# Patient Record
Sex: Male | Born: 1935 | Race: White | Hispanic: No | Marital: Married | Smoking: Former smoker
Health system: Southern US, Community
[De-identification: ages and names within clinical notes are randomized; demographics above are authoritative.]

## PROBLEM LIST (undated history)

## (undated) DIAGNOSIS — G309 Alzheimer's disease, unspecified: Secondary | ICD-10-CM

## (undated) DIAGNOSIS — F0280 Dementia in other diseases classified elsewhere without behavioral disturbance: Secondary | ICD-10-CM

## (undated) HISTORY — PX: CORONARY ANGIOPLASTY WITH STENT PLACEMENT: SHX49

---

## 1999-08-27 ENCOUNTER — Other Ambulatory Visit: Admission: RE | Admit: 1999-08-27 | Discharge: 1999-08-27 | Payer: Self-pay | Admitting: Otolaryngology

## 2004-01-03 ENCOUNTER — Ambulatory Visit: Payer: Self-pay | Admitting: Podiatry

## 2004-10-15 ENCOUNTER — Ambulatory Visit: Payer: Self-pay | Admitting: Gastroenterology

## 2005-01-26 ENCOUNTER — Ambulatory Visit: Payer: Self-pay | Admitting: Podiatry

## 2005-01-29 ENCOUNTER — Ambulatory Visit: Payer: Self-pay | Admitting: Podiatry

## 2005-10-01 ENCOUNTER — Ambulatory Visit: Payer: Self-pay | Admitting: Ophthalmology

## 2005-10-14 ENCOUNTER — Ambulatory Visit: Payer: Self-pay | Admitting: Ophthalmology

## 2005-11-09 ENCOUNTER — Ambulatory Visit: Payer: Self-pay | Admitting: Family Medicine

## 2006-03-29 ENCOUNTER — Ambulatory Visit: Payer: Self-pay | Admitting: Otolaryngology

## 2006-04-15 ENCOUNTER — Ambulatory Visit: Payer: Self-pay | Admitting: Otolaryngology

## 2006-04-15 ENCOUNTER — Other Ambulatory Visit: Payer: Self-pay

## 2006-04-20 ENCOUNTER — Ambulatory Visit: Payer: Self-pay | Admitting: Otolaryngology

## 2007-01-22 ENCOUNTER — Encounter: Admission: RE | Admit: 2007-01-22 | Discharge: 2007-01-22 | Payer: Self-pay | Admitting: Orthopedic Surgery

## 2007-02-13 IMAGING — US SCREENING ULTRASOUND OF ABDOMINAL AORTA
1 series · 17 of 19 positions shown · non-contrast
Comparison: none

REASON FOR EXAM: Abnormal abdominal xray, evaluate for poss abdominal
aortic aneurysm, back pain
COMMENTS:

[Series 1: screening ultrasound of abdominal aorta · 17 of 19 slices shown]
[im 1/19]
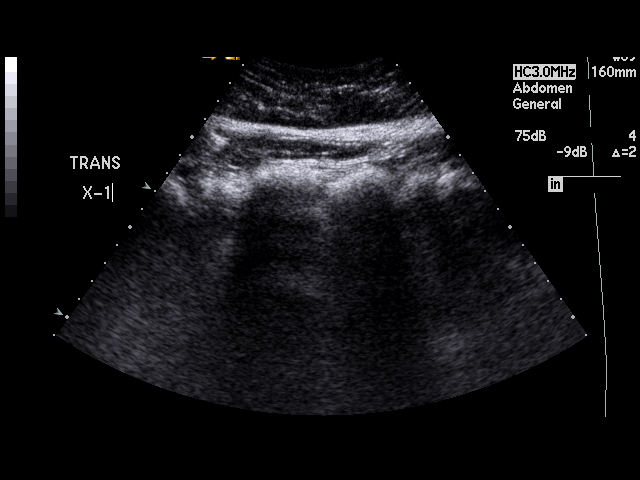
[im 2/19]
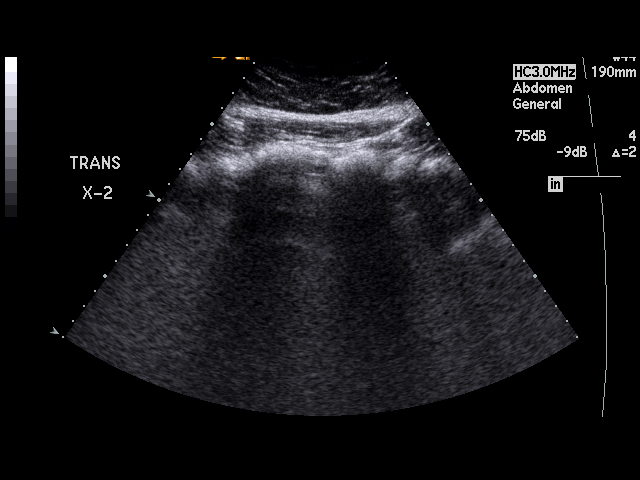
[im 3/19]
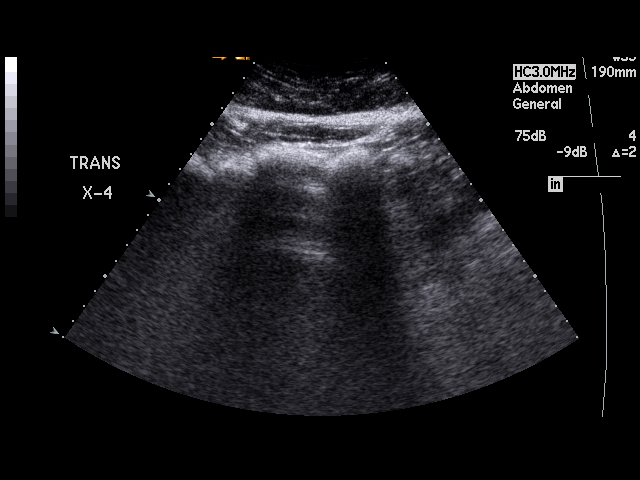
[im 4/19]
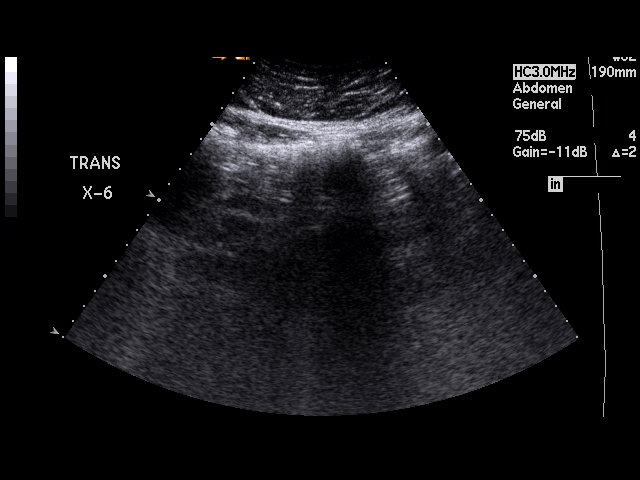
[im 6/19]
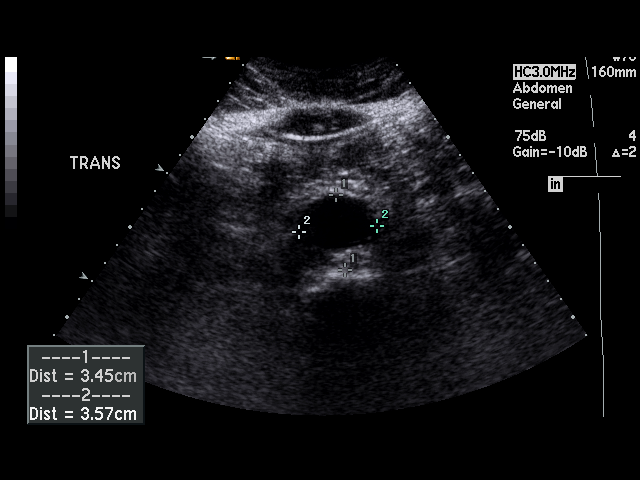
[im 7/19]
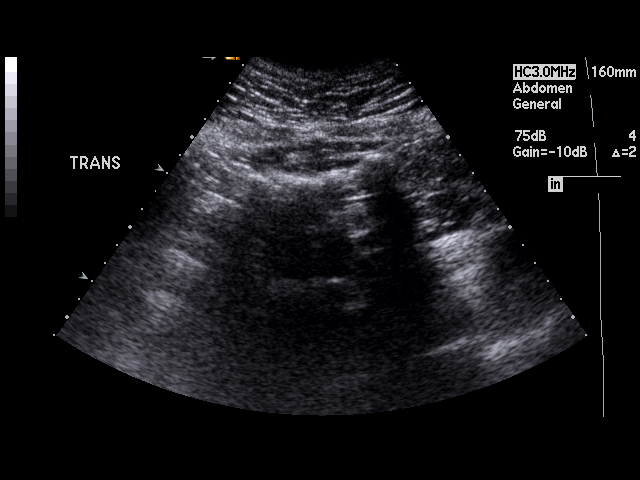
[im 8/19]
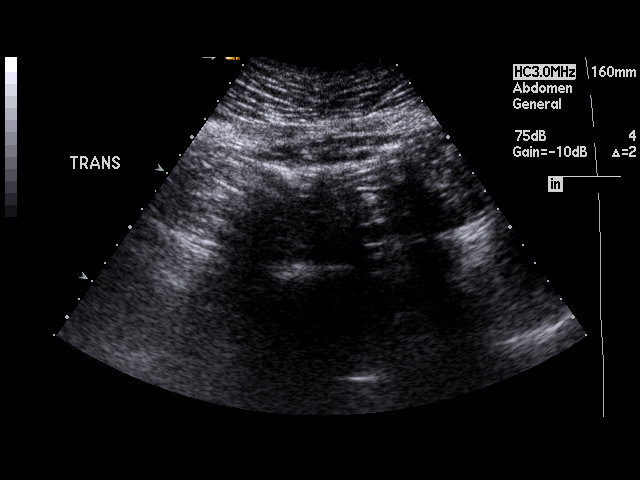
[im 9/19]
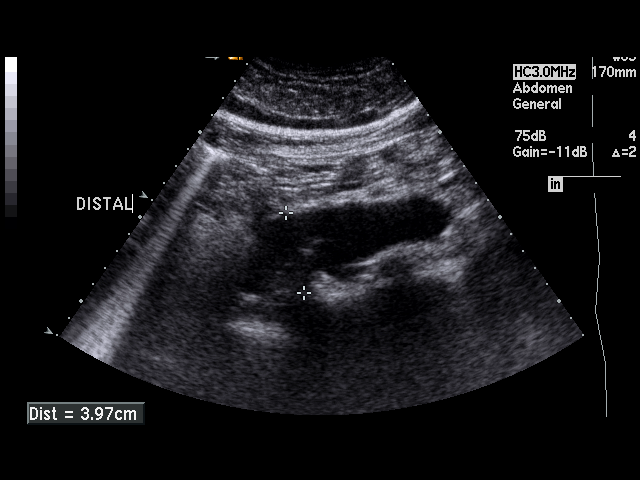
[im 10/19]
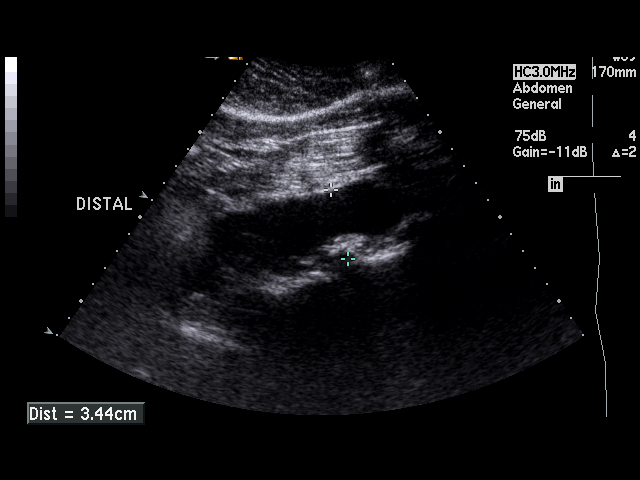
[im 11/19]
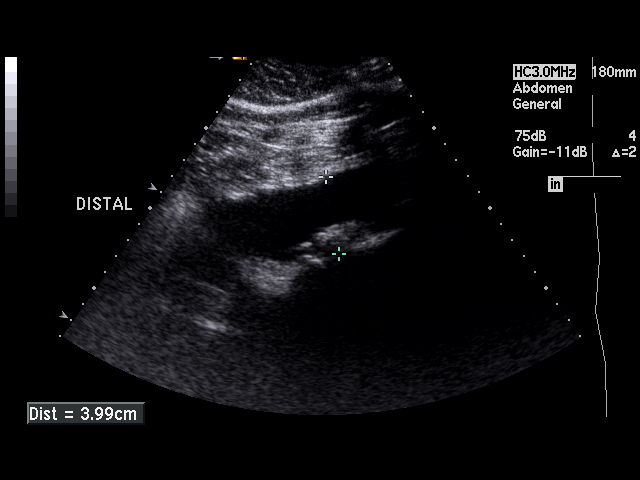
[im 12/19]
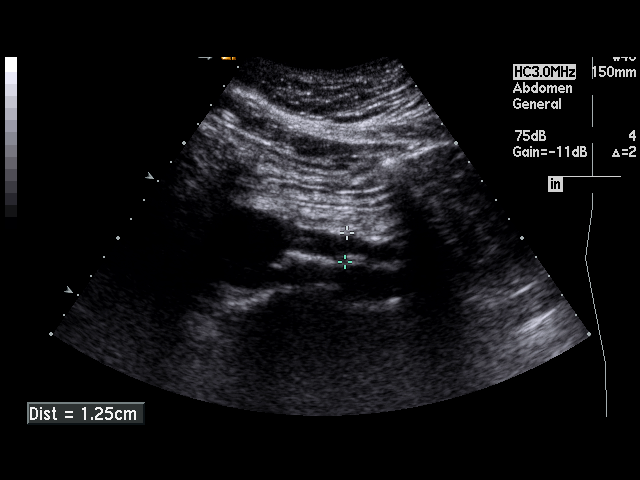
[im 13/19]
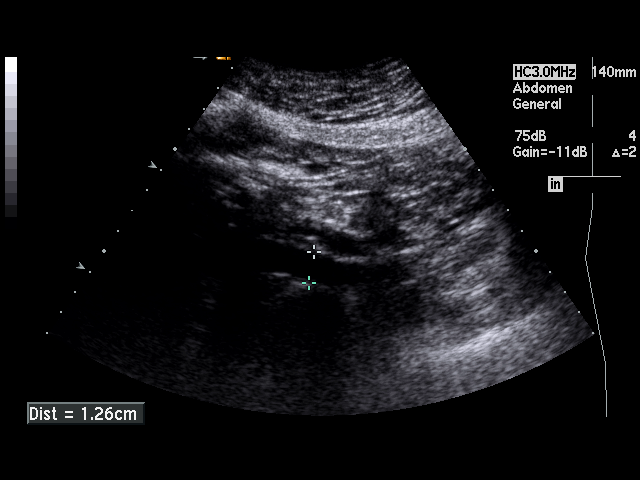
[im 14/19]
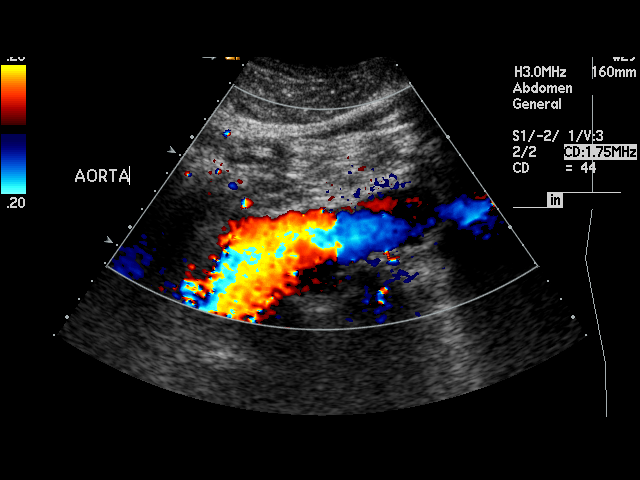
[im 16/19]
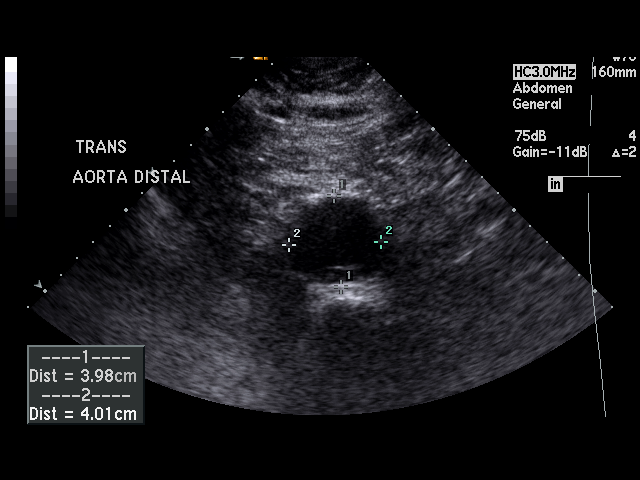
[im 17/19]
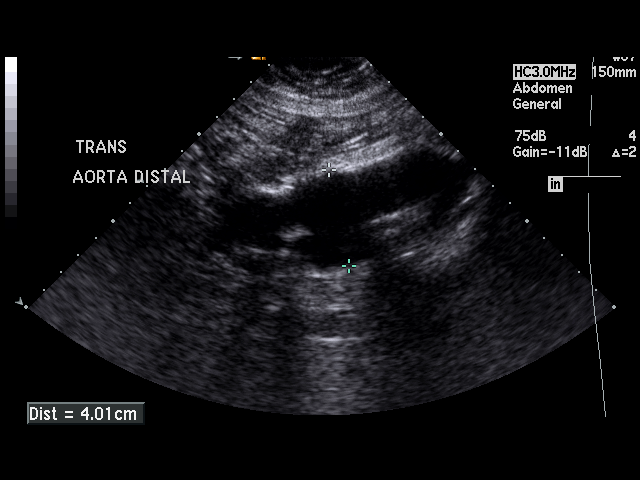
[im 18/19]
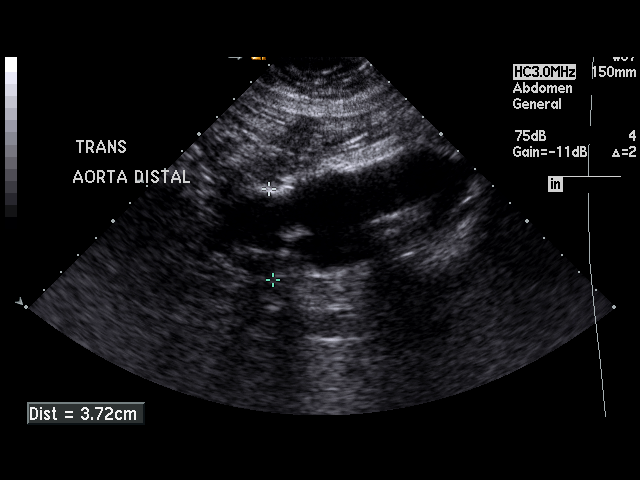
[im 19/19]
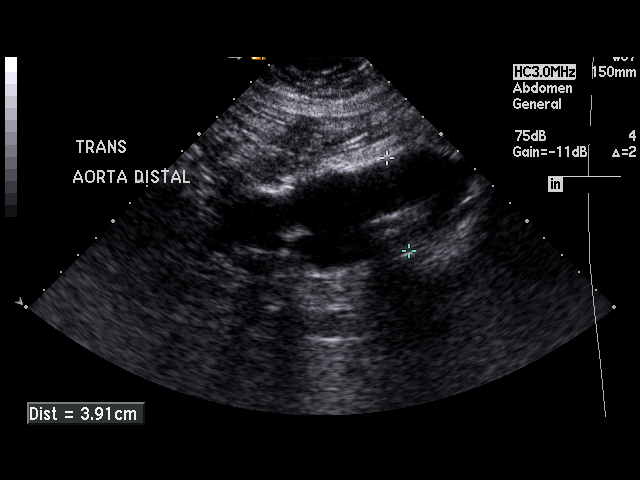

[17 of 19 positions shown; findings below may reference images not displayed]

PROCEDURE:     US  - US EXAM AAA SCREENING  - November 09, 2005  [DATE]

RESULT:     The abdominal aorta exhibits an infrarenal aneurysm measuring a
maximum of approximately 4 cm in diameter.  More proximally the aorta still
measures more than 3 cm in diameter.  The common iliac arteries are normal
in caliber.
IMPRESSION: There is an infrarenal abdominal aortic aneurysm measuring 4 cm in diameter.
 Further evaluation with abdominal CT scanning would be of value in an
effort to more adequately assess the caliber of the aorta throughout its
course in the abdomen.

## 2007-12-25 ENCOUNTER — Ambulatory Visit: Payer: Self-pay | Admitting: Unknown Physician Specialty

## 2009-03-20 ENCOUNTER — Ambulatory Visit: Payer: Self-pay | Admitting: Urology

## 2009-03-24 ENCOUNTER — Ambulatory Visit: Payer: Self-pay | Admitting: Urology

## 2009-04-23 ENCOUNTER — Ambulatory Visit: Payer: Self-pay | Admitting: Vascular Surgery

## 2009-06-17 ENCOUNTER — Ambulatory Visit: Payer: Self-pay | Admitting: Vascular Surgery

## 2009-06-25 ENCOUNTER — Inpatient Hospital Stay: Payer: Self-pay | Admitting: Vascular Surgery

## 2009-10-23 ENCOUNTER — Ambulatory Visit: Payer: Self-pay | Admitting: Urology

## 2009-10-29 ENCOUNTER — Ambulatory Visit: Payer: Self-pay | Admitting: Urology

## 2009-10-30 ENCOUNTER — Ambulatory Visit: Payer: Self-pay | Admitting: Urology

## 2010-11-30 ENCOUNTER — Ambulatory Visit: Payer: Self-pay | Admitting: Urology

## 2011-01-07 ENCOUNTER — Ambulatory Visit: Payer: Self-pay | Admitting: Urology

## 2011-01-13 ENCOUNTER — Ambulatory Visit: Payer: Self-pay | Admitting: Urology

## 2011-01-14 ENCOUNTER — Emergency Department: Payer: Self-pay | Admitting: Emergency Medicine

## 2011-01-15 LAB — PATHOLOGY REPORT

## 2011-03-31 ENCOUNTER — Ambulatory Visit: Payer: Self-pay | Admitting: Urology

## 2011-04-01 LAB — URINE CULTURE

## 2011-04-07 ENCOUNTER — Ambulatory Visit: Payer: Self-pay | Admitting: Urology

## 2011-04-11 LAB — PATHOLOGY REPORT

## 2012-03-29 ENCOUNTER — Ambulatory Visit: Payer: Self-pay | Admitting: Family Medicine

## 2012-04-13 ENCOUNTER — Ambulatory Visit: Payer: Self-pay

## 2012-05-03 ENCOUNTER — Ambulatory Visit: Payer: Self-pay

## 2012-05-24 ENCOUNTER — Ambulatory Visit: Payer: Self-pay

## 2012-06-20 ENCOUNTER — Ambulatory Visit: Payer: Self-pay | Admitting: Urology

## 2012-06-20 LAB — BASIC METABOLIC PANEL WITH GFR
Anion Gap: 7
BUN: 17 mg/dL
Calcium, Total: 8.4 mg/dL — ABNORMAL LOW
Chloride: 106 mmol/L
Co2: 26 mmol/L
Creatinine: 0.96 mg/dL
EGFR (African American): >60
EGFR (Non-African Amer.): >60
Glucose: 81 mg/dL
Osmolality: 278
Potassium: 4.4 mmol/L
Sodium: 139 mmol/L

## 2012-07-03 ENCOUNTER — Ambulatory Visit: Payer: Self-pay | Admitting: Urology

## 2012-08-10 ENCOUNTER — Ambulatory Visit: Payer: Self-pay | Admitting: Urology

## 2012-08-18 ENCOUNTER — Ambulatory Visit: Payer: Self-pay | Admitting: Urology

## 2012-11-27 ENCOUNTER — Ambulatory Visit: Payer: Self-pay | Admitting: Urology

## 2012-11-27 LAB — HEMOGLOBIN: HGB: 11.2 g/dL — ABNORMAL LOW (ref 13.0–18.0)

## 2012-12-04 ENCOUNTER — Ambulatory Visit: Payer: Self-pay | Admitting: Urology

## 2013-03-12 ENCOUNTER — Ambulatory Visit: Payer: Self-pay | Admitting: Urology

## 2014-06-14 NOTE — Op Note (Signed)
PATIENT NAME:  Nicholas [name], Nicholas [name] MR#:  [name] DATE OF BIRTH:  1931/12/07  DATE OF PROCEDURE:  08/18/2012  PREOPERATIVE DIAGNOSIS:  Distal right ureteral calculus with urethral stricture.   POSTOPERATIVE DIAGNOSIS:  Distal right ureteral calculus with urethral stricture.   PROCEDURE: Cystoscopy with ureteroscopy, laser lithotripsy and a direct vision internal urethrotomy of a bulbous urethral stricture that is chronic.   SURGEON:  Richard D. Edwyna ShellHart, DO  DESCRIPTION OF PROCEDURE:  With the patient sterilely prepped and draped in supine lithotomy position and good relaxation from a general anesthetic, I began the procedure. Unfortunately, the camera was not working so I did everything with my eye without camera today but this was nothing too difficult as it was the way I have always done it in the past. I viewed the urethra easily.  The stricture is a moderate-caliber stricture that will not admit a 19 to 20-French sheath, so I incised it under direct vision with a 0 degree lens and a cold cutting knife anteriorly and laterally to the left and right. That easily opened the stricture and I am able to view the prostate and the bladder. The prostate is small with a tiny middle lobe. The stent is lying right on the bladder and there is some irritation where the stent has been laying.  With a 21-French sheath and a 30 degree lens I view the stent, grasp it and pull it to the urethral meatus, then  instrument the stent with a 0.035 Glidewire. This puts the Glidewire up into the kidney. I used this as a safety wire. Then I used the telescoping 7-French ureteroscope and view the ureter. The ureter is easily instrumented as the stent has been in for a couple of weeks. The calculus is visualized. A 365 micron holmium laser was used to disintegrate this. This creates a good milieu for removal as it is in several small pieces. They are removed with a 4-wire, 0-tip Nitinol basket. There was no difficulty with the  removal, there is minimal bleeding. There was minimal shockwaves from the laser as we had it at the very lowest power and this very soft calculus disintegrated so then I evacuate any tiny clots that are left in the bladder. Where the stent was lying is an area of excoriation which should clear up so then I am able to empty the bladder, put 30 mL of 0.5% Marcaine in the bladder, [name] and O suppository and pull and remove the wire. The rectal exam reveals no tumors, masses or growths in the prostate or rectum.   ____________________________ Caralyn Guileichard D. Edwyna ShellHart, DO rdh:cs D: 08/18/2012 14:00:08 ET T: 08/18/2012 22:18:09 ET JOB#: [id number]  cc: Caralyn Guileichard D. Edwyna ShellHart, DO, <Dictator> RICHARD D HART DO ELECTRONICALLY SIGNED 09/15/2012 16:34

## 2014-06-14 NOTE — Op Note (Signed)
PATIENT NAME:  Nicholas [name], Nicholas [name] MR#:  [id number] DATE OF BIRTH:  [birth year]  DATE OF PROCEDURE:  12/04/2012  PREOPERATIVE DIAGNOSIS: Urethral stricture, ladder tumor.  POSTOPERATIVE DIAGNOSIS:  Urethral stricture, bladder tumor.    PROCEDURE: Transurethral resection medium-sized bladder tumor at the bladder neck with direct vision internal urethrotomy.    SURGEON: Khoen Genet D. Edwyna ShellHart, DO  ANESTHESIA: General.   PROCEDURE:  With the patient sterilely prepped and draped in supine lithotomy position, I used a standard direct vision internal urethrotomy scope with a 0.038 coaxial wire, placed the wire through the stricture and then cut along the wire to open up the stricture. There was no problem doing this, it opens up to admit a [facility] resect[facility]. The resectoscope sheath was placed in the bladder, bladder was viewed and there is a tumor at the bladder neck at the very anterior portion behind the bladder neck. So I am able to resect it with some difficulty but it is resected well with an Iglesias resectoscope and 26-French resectoscope and cutting loop. The pieces are evacuated out of the bladder. They are sent to pathology. There is no bleeding at the end of the procedure.  I put a Foley in without constant irrigation. We just have a regular 24-French Foley. We put some Marcaine in the bladder through the Foley in the amount of 30 mL of 0.5% plain Marcaine and the irrigation is clear. [name] and O suppository is placed. Bimanual exam reveals no fixation of the bladder. The patient is sent to recovery in satisfactory condition and followup will be done in 24 to 48 hours to remove the Foley.    ____________________________ Caralyn Guileichard D. Edwyna ShellHart, DO rdh:cs D: 12/04/2012 16:51:40 ET T: 12/04/2012 19:18:09 ET JOB#: [id number]  cc: Caralyn Guileichard D. Edwyna ShellHart, DO, <Dictator> Windell Musson D Nattie Lazenby DO ELECTRONICALLY SIGNED 01/15/2013 7:01

## 2014-06-14 NOTE — Op Note (Signed)
PATIENT NAME:  [name], Nicholas B MR#:  [name] DATE OF BIRTH:  [birth year]  DATE OF PROCEDURE:  07/03/2012  PREOPERATIVE DIAGNOSES: Recurrent bladder tumor and bulbous urethral stricture.  POSTOPERATIVE DIAGNOSES: Recurrent bladder tumor and bulbous urethral stricture with a  distal right ureteral calculus.   PROCEDURE: Cystoscopy, direct vision internal urethrotomy of a stricture, moderate-sized with fulguration of bladder tumors and placement of a right ureteral stent. With the patient sterilely prepped and draped in supine lithotomy position and good relaxation from a general anesthetic,  the procedure was begun. The surgeon has good vision with a direct vision internal urethrotomy scope. A 0.035 wire is placed through the stricture. Then along the wire, I incised the stricture in 1 spot. I tried not to cause any bleeding from this incision. I then am able to bypass the stricture easily with a scope and view the bladder. The cause of his pain, urgency, and frequency looks like a distal right ureteral calculus. Small tiny bladder tumors are recurrent on the right lateral  and anterior side of the right ureter where a previous tumor had been resected. These are fulgurated. Then after I fulgurated them with a Bovie electrode, I placed a 0.038 wire up past the calculus, dilating with a 6-French ureteral catheter, open-ended, which I had placed over the wire. Then I withdraw this, leave the wire in place and through the scope place a 7-French ureteral catheter into the kidney and it is in good position in the bladder by visualization. The bladder was emptied. Marcaine 30 mL was placed in the bladder. B and O suppository was placed and he was sent to recovery in satisfactory condition. The string was cut at the meatus. The patient tolerated the procedure well and I will follow him up in the office on Thursday.   ____________________________ Caralyn Guileichard D. Edwyna ShellHart, DO rdh:aw D: 07/03/2012 18:00:05  ET T: 07/04/2012 08:07:41 ET JOB#: [id number]  cc: Caralyn Guileichard D. Edwyna ShellHart, DO, <Dictator> Torianna Junio D Marquee Fuchs DO ELECTRONICALLY SIGNED 07/20/2012 17:14

## 2014-06-14 NOTE — H&P (Signed)
PATIENT NAME:  Nicholas [name], Nicholas [name] MR#:  [id number] DATE OF BIRTH:  1932-02-18  DATE OF ADMISSION:  07/03/2012  HISTORY OF PRESENT ILLNESS:  This patient presents with chronic discomfort at the head of his penis with urgency, frequency, dysuria and known history of bladder carcinoma.  Cystoscopy in the office has revealed the presence of a bulbous urethral stricture which was slightly dilated using flexible scope, but is still significant and only admits the 10-French diameter flexible scope with difficulty. He has behind this stricture, or proximal to it, he has quite a bit of irritation in his prostate.  Additionally, he has what appears to be possible recurrent bladder carcinoma that needs to be biopsied.  His history is that he has originally presented with a hematuria. He was placed on Cipro.  Symptoms include dysuria, urgency, frequency, hesitancy and pain on the tip of his penis. They last for 3 times an hour. He said it is relieved by oxycodone/acetaminophen.  His medications include Tramadol, donepezil, and we have tried him on Uribel.  He has been on tamsulosin 0.4 mg daily, which has not helped him.  REVIEW OF SYSTEMS:   CONSTITUTIONAL:  Today he is feeling well, but has no chills, fatigue, fever, or weight gain and is complaining mostly about his pain on voiding.   SKIN: He has had no hair loss, rash or itching. HEENT:  Cannot hear very well and you have to keep repeating yourself.  He appears well hydrated orally and has no problems with his sight.   NECK:  He has no history of thyroid disease. No neck pain. No diabetes.  RESPIRATORY:  He is not short of breath. Previous history of smoking, a pack a day for 40 years. No chronic cough.  With excessive exercise he has a little bit of shortness of breath on exertion. He does not have wheezing. He does not have sleep apnea. CARDIOVASCULAR:  Complains of no chest pain, severe shortness of breath, peripheral edema. He does not have leg edema at the  end of the day.  GASTROINTESTINAL:  No constipation, diarrhea.  Has nausea and vomiting with certain pain meds, such as hydrocodone.  MALE/GENITOURINARY:  See the HPI.   MUSCULOSKELETAL:  He has some back pain. No knee or ankle or elbow joint pain. NEUROLOGICAL:  He has some decreased memory. I would say he does not remember things very well at all.   PSYCHIATRIC:  Very anxious over his pain, depressed. ENDOCRINE:  No heat intolerance, tired, sluggishness, diabetes or thyroid disease.  HEMATOLOGIC:  No abnormal bleeding, bruising or history of anemia.  PAST SURGICAL HISTORY: TURBT. He has an aortoiliac stent graft.   PHYSICAL EXAMINATION:   GENERAL:  Tall, well-nourished, well-developed Caucasian male in acute distress from his voiding problems.  Review of CT with the patient reveals that he has probably passed his stone distally into his distal ureter and bladder as he is not having any back pain related to a previous stone. He had some calculi in his bladder.   LUNGS:  Today are clear to auscultation.  HEART:  Sounds are regular rate and rhythm.  ABDOMEN:  Bowel sounds present. Negative Murphy's as well as McBurney.   LYMPHATICS:  No supraclavicular, axillary or groin lymphadenopathy.  PERIPHERAL:  No peripheral edema. Leg strength is good. No joint pain. No calf pain. No evidence of pulmonary emboli type calf and leg pain.  HEENT:  His sight and hearing are excellent.  NECK:  Neck is palpated. No thyroid  enlargement. Neck is somewhat supple for his age. MUSCULOSKELETAL:  His upper extremities show no loss of range of motion. Hips have no loss of range of motion. So his musculoskeletal status is intact, except for his low back pain, which is more sacroiliac type pain, left and right.   IMPRESSION: Is that the patient has stricture disease, probable recurrent bladder tumor.   PLANS:  Cystoscopy, transurethral resection of bladder tumor, possible catheterization, and at most a 24 hour stay but  doubtful. The patient may have a catheter after incision of his stricture depending upon the amount of bleeding I encounter. I will try to do a cold cutting knife incision so we have minimal bleeding.  Followup will be either overnight in the hospital, but probably I will send him home either with or without catheter depending upon what we find at time of procedure and how much resection or biopsy I have to do and how much control of bleeding we have.  ____________________________ Caralyn Guileichard D. Edwyna ShellHart, DO rdh:sb D: 06/20/2012 09:05:00 ET T: 06/20/2012 09:20:21 ET JOB#: [id number]  cc: Caralyn Guileichard D. Edwyna ShellHart, DO, <Dictator> Sigmond Patalano D Maeci Kalbfleisch DO ELECTRONICALLY SIGNED 07/20/2012 17:12

## 2014-06-16 NOTE — Op Note (Signed)
PATIENT NAME:  [name], Nicholas B MR#:  [name] DATE OF BIRTH:  02-22-1932  DATE OF PROCEDURE:  04/07/2011  PREOPERATIVE DIAGNOSES:  1. Recurrent hematuria.  2. Bladder erythema with abnormal cytology.  3. History of transitional cell carcinoma of the bladder.   POSTOPERATIVE DIAGNOSES:  1. Recurrent hematuria.  2. Bladder erythema with abnormal cytology.  3. History of transitional cell carcinoma of the bladder.   PROCEDURE: Transurethral resection of bladder tumor (medium).   SURGEON: Irineo AxonScott Tearra Ouk, M.D.   ASSISTANT: None.   ANESTHESIA: General.   INDICATIONS: This is a 81 year old male with a history of recurrent transitional cell carcinoma of the bladder. He underwent internal urethrotomy and resection of a small bladder tumor in November 2012. He had persistent dysuria postoperatively and subsequently developed recurrent gross hematuria. Office cystoscopy showed complete healing of the stricture without recurrence. There was bladder erythema and friability. Urine cytology showed dysplastic urothelial cells and a FISH was positive. He presents for bladder biopsy/resection.   DESCRIPTION OF PROCEDURE: The patient was taken to the Operating Room where a general anesthetic was administered. He was placed in the low lithotomy position and his external genitalia were prepped and draped sterilely. A 28 French continuous flow resectoscope sheath with visual obturator was lubricated and passed under direct vision. The urethra was normal in caliber. The site of his prior internal urethrotomy was opened and the sheath passed easily. [facility] with a loop was then placed in the sheath. Panendoscopy demonstrated an area of early papillary change at the upper bladder neck and approximately at the 2 o'clock  position. There was a large patch of erythema and bladder mucosal edema on the posterior wall measuring approximately 4 cm. The ureteral orifices were normal appearing with clear  efflux. No other abnormalities were noted. The early papillary changes at the bladder neck were resected, removed with irrigation and sent in formalin. A second specimen of the area of erythema was resected. The resection site and residual erythema was cauterized with the button electrode. The bladder neck area was cauterized with a button electrode. There was incidentally noted to be a long extension of the verumontanum, which was also resected and sent separately. At the completion of the procedure, hemostasis was adequate. The bladder was emptied and the resectoscope was removed. An 8518 French catheter was placed with return of clear effluent upon irrigation. The patient was taken to the PAC-U in stable condition. A B and O suppository was placed per rectum. ____________________________ Verna CzechScott C. Lonna CobbStoioff, MD scs:slb D: 04/07/2011 11:39:51 ET T: 04/07/2011 12:20:02 ET JOB#: [id number]  cc: Lorin PicketScott C. Lonna CobbStoioff, MD, <Dictator> Riki AltesSCOTT C Leightyn Cina MD ELECTRONICALLY SIGNED 04/09/2011 3:17

## 2014-11-14 ENCOUNTER — Telehealth: Payer: Self-pay | Admitting: *Deleted

## 2014-11-14 NOTE — Telephone Encounter (Signed)
Received referral intake for hospice care for patient. Requesting oncologist last office note. Patient has known hx of bladder cancer. Hospice RN wants to know if patient was ever tx here at cancer center by Dr. Doylene Canning.  Reviewed chart. It does not appear that patient was tx here at the [facility]. It does appear that patient may have been tx with [facility] with Dr. Edwyna Shell. Deferred call to the urology office.

## 2014-11-26 ENCOUNTER — Observation Stay
Admission: EM | Admit: 2014-11-26 | Discharge: 2014-11-27 | Disposition: A | Discharge status: Home / Self Care | Attending: Internal Medicine | Admitting: Internal Medicine

## 2014-11-26 ENCOUNTER — Encounter: Payer: Self-pay | Admitting: Emergency Medicine

## 2014-11-26 ENCOUNTER — Emergency Department

## 2014-11-26 ENCOUNTER — Observation Stay
Admit: 2014-11-26 | Discharge: 2014-11-26 | Disposition: A | Discharge status: Home / Self Care | Attending: Specialist | Admitting: Specialist

## 2014-11-26 DIAGNOSIS — Z955 Presence of coronary angioplasty implant and graft: Secondary | ICD-10-CM | POA: Diagnosis not present

## 2014-11-26 DIAGNOSIS — R55 Syncope and collapse: Secondary | ICD-10-CM | POA: Diagnosis not present

## 2014-11-26 DIAGNOSIS — Z823 Family history of stroke: Secondary | ICD-10-CM | POA: Insufficient documentation

## 2014-11-26 DIAGNOSIS — Z8249 Family history of ischemic heart disease and other diseases of the circulatory system: Secondary | ICD-10-CM | POA: Insufficient documentation

## 2014-11-26 DIAGNOSIS — I739 Peripheral vascular disease, unspecified: Secondary | ICD-10-CM | POA: Diagnosis not present

## 2014-11-26 DIAGNOSIS — S61219A Laceration without foreign body of unspecified finger without damage to nail, initial encounter: Secondary | ICD-10-CM

## 2014-11-26 DIAGNOSIS — Z791 Long term (current) use of non-steroidal anti-inflammatories (NSAID): Secondary | ICD-10-CM | POA: Insufficient documentation

## 2014-11-26 DIAGNOSIS — Z87891 Personal history of nicotine dependence: Secondary | ICD-10-CM | POA: Diagnosis not present

## 2014-11-26 DIAGNOSIS — Z79899 Other long term (current) drug therapy: Secondary | ICD-10-CM | POA: Diagnosis not present

## 2014-11-26 DIAGNOSIS — S0101XA Laceration without foreign body of scalp, initial encounter: Secondary | ICD-10-CM

## 2014-11-26 DIAGNOSIS — S0191XA Laceration without foreign body of unspecified part of head, initial encounter: Secondary | ICD-10-CM | POA: Diagnosis not present

## 2014-11-26 DIAGNOSIS — S0990XA Unspecified injury of head, initial encounter: Secondary | ICD-10-CM | POA: Diagnosis not present

## 2014-11-26 DIAGNOSIS — G309 Alzheimer's disease, unspecified: Secondary | ICD-10-CM | POA: Insufficient documentation

## 2014-11-26 DIAGNOSIS — Z833 Family history of diabetes mellitus: Secondary | ICD-10-CM | POA: Insufficient documentation

## 2014-11-26 DIAGNOSIS — R918 Other nonspecific abnormal finding of lung field: Secondary | ICD-10-CM | POA: Diagnosis not present

## 2014-11-26 DIAGNOSIS — I471 Supraventricular tachycardia, unspecified: Secondary | ICD-10-CM | POA: Diagnosis present

## 2014-11-26 DIAGNOSIS — I10 Essential (primary) hypertension: Secondary | ICD-10-CM | POA: Diagnosis not present

## 2014-11-26 DIAGNOSIS — W19XXXA Unspecified fall, initial encounter: Secondary | ICD-10-CM | POA: Diagnosis not present

## 2014-11-26 DIAGNOSIS — F0280 Dementia in other diseases classified elsewhere without behavioral disturbance: Secondary | ICD-10-CM | POA: Insufficient documentation

## 2014-11-26 HISTORY — DX: Alzheimer's disease, unspecified: G30.9

## 2014-11-26 HISTORY — DX: Dementia in other diseases classified elsewhere, unspecified severity, without behavioral disturbance, psychotic disturbance, mood disturbance, and anxiety: F02.80

## 2014-11-26 LAB — COMPREHENSIVE METABOLIC PANEL
ALBUMIN: 3.1 g/dL — AB (ref 3.5–5.0)
ALK PHOS: 100 U/L (ref 38–126)
ALT: 16 U/L — ABNORMAL LOW (ref 17–63)
ANION GAP: 7 (ref 5–15)
AST: 33 U/L (ref 15–41)
BUN: 13 mg/dL (ref 6–20)
CO2: 21 mmol/L — AB (ref 22–32)
Calcium: 8.5 mg/dL — ABNORMAL LOW (ref 8.9–10.3)
Chloride: 108 mmol/L (ref 101–111)
Creatinine, Ser: 0.85 mg/dL (ref 0.61–1.24)
GFR calc Af Amer: >60 mL/min (ref >60)
GFR calc non Af Amer: >60 mL/min (ref >60)
GLUCOSE: 120 mg/dL — AB (ref 65–99)
POTASSIUM: 4 mmol/L (ref 3.5–5.1)
SODIUM: 136 mmol/L (ref 135–145)
Total Bilirubin: 1.2 mg/dL (ref 0.3–1.2)
Total Protein: 8.3 g/dL — ABNORMAL HIGH (ref 6.5–8.1)

## 2014-11-26 LAB — CBC
HEMATOCRIT: 45.5 % (ref 40.0–52.0)
HEMOGLOBIN: 14.9 g/dL (ref 13.0–18.0)
MCH: 28.5 pg (ref 26.0–34.0)
MCHC: 32.8 g/dL (ref 32.0–36.0)
MCV: 86.8 fL (ref 80.0–100.0)
Platelets: 147 10*3/uL — ABNORMAL LOW (ref 150–440)
RBC: 5.24 MIL/uL (ref 4.40–5.90)
RDW: 15.2 % — AB (ref 11.5–14.5)
WBC: 5.8 10*3/uL (ref 3.8–10.6)

## 2014-11-26 LAB — TROPONIN I
Troponin I: <0.03 ng/mL (ref <0.031)
Troponin I: <0.03 ng/mL (ref <0.031)
Troponin I: <0.03 ng/mL (ref <0.031)

## 2014-11-26 MED ORDER — HEPARIN SODIUM (PORCINE) 5000 UNIT/ML IJ SOLN
5000.0000 [IU] | Freq: Three times a day (TID) | INTRAMUSCULAR | Status: DC
  Administered 2014-11-26 – 2014-11-27 (×3): 5000 [IU] via SUBCUTANEOUS
  Filled 2014-11-26 (×4): qty 1

## 2014-11-26 MED ORDER — METOPROLOL TARTRATE 25 MG PO TABS
25.0000 mg | ORAL_TABLET | Freq: Two times a day (BID) | ORAL | Status: DC
  Administered 2014-11-26 – 2014-11-27 (×2): 25 mg via ORAL
  Filled 2014-11-26 (×3): qty 1

## 2014-11-26 MED ORDER — LORATADINE 10 MG PO TABS
10.0000 mg | ORAL_TABLET | Freq: Every day | ORAL | Status: DC
  Administered 2014-11-26 – 2014-11-27 (×2): 10 mg via ORAL
  Filled 2014-11-26 (×2): qty 1

## 2014-11-26 MED ORDER — SODIUM CHLORIDE 0.9 % IJ SOLN
3.0000 mL | Freq: Two times a day (BID) | INTRAMUSCULAR | Status: DC
  Administered 2014-11-26 – 2014-11-27 (×3): 3 mL via INTRAVENOUS

## 2014-11-26 MED ORDER — ACETAMINOPHEN 325 MG PO TABS: 650.0000 mg | ORAL_TABLET | Freq: Four times a day (QID) | ORAL | Status: DC | PRN

## 2014-11-26 MED ORDER — ACETAMINOPHEN 650 MG RE SUPP: 650.0000 mg | Freq: Four times a day (QID) | RECTAL | Status: DC | PRN

## 2014-11-26 MED ORDER — METOPROLOL TARTRATE 25 MG PO TABS
25.0000 mg | ORAL_TABLET | Freq: Once | ORAL | Status: AC
  Administered 2014-11-26: 25 mg via ORAL
  Filled 2014-11-26: qty 1

## 2014-11-26 MED ORDER — METOPROLOL TARTRATE 1 MG/ML IV SOLN
2.5000 mg | Freq: Once | INTRAVENOUS | Status: AC
  Administered 2014-11-26: 5 mg via INTRAVENOUS
  Filled 2014-11-26: qty 5

## 2014-11-26 MED ORDER — QUETIAPINE FUMARATE 25 MG PO TABS
50.0000 mg | ORAL_TABLET | Freq: Two times a day (BID) | ORAL | Status: DC
  Administered 2014-11-26 – 2014-11-27 (×3): 50 mg via ORAL
  Filled 2014-11-26 (×3): qty 2

## 2014-11-26 MED ORDER — ONDANSETRON HCL 4 MG PO TABS: 4.0000 mg | ORAL_TABLET | Freq: Four times a day (QID) | ORAL | Status: DC | PRN

## 2014-11-26 MED ORDER — ONDANSETRON HCL 4 MG/2ML IJ SOLN: 4.0000 mg | Freq: Four times a day (QID) | INTRAMUSCULAR | Status: DC | PRN

## 2014-11-26 NOTE — Consult Note (Signed)
[facility] Cardiology Consultation Note  Patient ID: Nicholas [name], MRN: [id number], DOB/AGE: [birth year] 81 y.o. Admit date: 11/26/2014   Date of Consult: 11/26/2014 Primary Physician: Sula Rumple, MD Primary Cardiologist: None  Chief Complaint:  Chief Complaint  Patient presents with  . Fall   Reason for Consult: atrial tachycardia after episode of fall  HPI: 81 y.o. male with known history of essential hypertension and history is peripheral vascular disease with apparent stenting having an episode where he fell. The patient patient fell and hit his head and was injured. The patient did not have any apparent syncope although history is difficult due to memory issues. The patient has had no evidence of previous falling in the past. Patient was seen in the emergency room and ended up with atrial tachycardia with rapid ventricular rate asymptomatic. There is been no evidence of chest pain congestive heart failure or other significant symptoms of hypotension. Currently EKG has converted back to normal sinus rhythm at this time  Past Medical History  Diagnosis Date  . Alzheimer disease       Surgical History:  Past Surgical History  Procedure Laterality Date  . Coronary angioplasty with stent placement       Home Meds: Prior to Admission medications   Medication Sig Start Date End Date Taking? Authorizing Provider  cetirizine (ZYRTEC) 10 MG tablet Take 10 mg by mouth daily.   Yes Historical Provider, MD  diphenhydrAMINE (SOMINEX) 25 MG tablet Take 25 mg by mouth daily as needed for itching or sleep.   Yes Historical Provider, MD  naproxen sodium (ANAPROX) 220 MG tablet Take 220 mg by mouth daily.   Yes Historical Provider, MD  QUEtiapine (SEROQUEL) 50 MG tablet Take 50 mg by mouth 2 (two) times daily.   Yes Historical Provider, MD    Inpatient Medications:     Allergies: No Known Allergies  Social History   Social History  . Marital Status: Married    Spouse  Name: N/A  . Number of Children: N/A  . Years of Education: N/A   Occupational History  . Not on file.   Social History Main Topics  . Smoking status: Former Games developer  . Smokeless tobacco: Not on file  . Alcohol Use: No  . Drug Use: No  . Sexual Activity: Not on file   Other Topics Concern  . Not on file   Social History Narrative  . No narrative on file     Family History  Problem Relation Age of Onset  . CVA Mother   . Peripheral vascular disease Father   . Diabetes Father   . Heart attack Brother      Review of Systems  Unable to assess due to some dementia Labs:  Recent Labs  11/26/14 0903  TROPONINI <0.03   Lab Results  Component Value Date   WBC 5.8 11/26/2014   HGB 14.9 11/26/2014   HCT 45.5 11/26/2014   MCV 86.8 11/26/2014   PLT 147* 11/26/2014    Recent Labs Lab 11/26/14 0903  NA 136  K 4.0  CL 108  CO2 21*  BUN 13  CREATININE 0.85  CALCIUM 8.5*  PROT 8.3*  BILITOT 1.2  ALKPHOS 100  ALT 16*  AST 33  GLUCOSE 120*   No results found for: CHOL, HDL, LDLCALC, TRIG No results found for: DDIMER  Radiology/Studies:  Ct Head Wo Contrast  11/26/2014   CLINICAL DATA:  Status post fall. Left head laceration. No loss of consciousness. Initial  encounter.  EXAM: CT HEAD WITHOUT CONTRAST  TECHNIQUE: Contiguous axial images were obtained from the base of the skull through the vertex without intravenous contrast.  COMPARISON:  Neck CT 03/29/2006.  FINDINGS: There is no evidence of acute intracranial hemorrhage, mass lesion, brain edema or extra-axial fluid collection. The ventricles and subarachnoid spaces are diffusely prominent, consistent with moderate atrophy. There is no CT evidence of acute cortical infarction. There are mild chronic small vessel ischemic changes within the periventricular and subcortical white matter bilaterally.  The visualized paranasal sinuses, mastoid air cells and middle ears are clear. The calvarium is intact. There is mild soft  tissue swelling in the left frontoparietal scalp.  IMPRESSION: 1. Left scalp soft tissue injury. No acute intracranial or calvarial findings. 2. Moderate atrophy and mild chronic small vessel ischemic changes.   Electronically Signed   By: Carey Bullocks M.D.   On: 11/26/2014 10:09   Dg Chest Portable 1 View  11/26/2014   CLINICAL DATA:  Pain following fall  EXAM: PORTABLE CHEST 1 VIEW  COMPARISON:  June 17, 2009  FINDINGS: There is no edema or consolidation. There is stable lateral apical pleural thickening on the left. The heart size and pulmonary vascularity are normal. No adenopathy. There is atherosclerotic change the aortic arch region. No bone lesions are appreciable.  IMPRESSION: No edema or consolidation.   Electronically Signed   By: Bretta Bang III M.D.   On: 11/26/2014 09:33    EKG: Normal sinus rhythm. Normal EKG  Weights: Filed Weights   11/26/14 0850  Weight: 237 lb (107.502 kg)     Physical Exam: Blood pressure 142/83, pulse 81, temperature 98.2 F (36.8 C), temperature source Oral, resp. rate 16, height  (1.854 m), weight 237 lb (107.502 kg), SpO2 96 %. Body mass index is 31.27 kg/(m^2). General: Well developed, well nourished, in no acute distress. Head eyes ears nose throat: Normocephalic, atraumatic, sclera non-icteric, no xanthomas, nares are without discharge. No apparent thyromegaly and/or mass  Lungs: Normal respiratory effort.  no wheezes, no rales, no rhonchi.  Heart: RRR with normal S1 S2. no murmur gallop, no rub, PMI is normal size and placement, carotid upstroke normal without bruit, jugular venous pressure is normal Abdomen: Soft, non-tender, non-distended with normoactive bowel sounds. No hepatomegaly. No rebound/guarding. No obvious abdominal masses. Abdominal aorta is normal size without bruit Extremities: No edema. no cyanosis, no clubbing, no ulcers  Peripheral : 2+ bilateral upper extremity pulses, 2+ bilateral femoral pulses, 2+ bilateral  dorsal pedal pulse Neuro: Alert and oriented. No facial asymmetry. No focal deficit. Moves all extremities spontaneously. Musculoskeletal: Normal muscle tone without kyphosis Psych:  Responds to questions appropriately with a normal affect.    Assessment: 81 year old male with essential hypertension with a fall and injury to his head with atrial tachycardia with rapid ventricular rate essentially asymptomatic at this time needing further treatment options  Plan: 1. Beta blocker 25 mg twice per day for heart rate control and maintenance of normal sinus rhythm 2. No anticoagulation due to fall risk and no evidence of atrial fibrillation at this time 3. Consider echocardiogram for LV systolic dysfunction valvular heart disease contributing to above 4. Begin ambulation and follow for further significant symptoms and need for adjustments of medication  Signed, Lamar Blinks M.D. [facility] Cardiology 11/26/2014, 12:52 PM

## 2014-11-26 NOTE — Progress Notes (Signed)
*  PRELIMINARY RESULTS* Echocardiogram 2D Echocardiogram has been performed.  Nicholas [name] 11/26/2014, 4:43 PM

## 2014-11-26 NOTE — ED Notes (Signed)
Hospitalist at bedside 

## 2014-11-26 NOTE — ED Notes (Signed)
Pt to ED via EMS transport from home, per pt's wife pt slipped and fell in the bathroom hitting head, denies any LOC, laceration to left side of head and laceration to right 3rd finger

## 2014-11-26 NOTE — ED Provider Notes (Signed)
[facility] Emergency Department Provider Note  ____________________________________________  Time seen: On arrival, via EMS  I have reviewed the triage vital signs and the nursing notes.   HISTORY  Chief Complaint Fall    HPI Nicholas [name] is a 81 y.o. male with a history of Alzheimer's dementia who had a mechanical fall today while in the bathroom. He suffered a laceration to the head and a laceration to the finger. He denies chest pain. He denies abdominal pain. He denies nausea denies vomiting. His wife reports he has a history of falls in the past. He lives at home.     Past Medical History  Diagnosis Date  . Alzheimer disease     There are no active problems to display for this patient.   Past Surgical History  Procedure Laterality Date  . Coronary angioplasty with stent placement      Current Outpatient Rx  Name  Route  Sig  Dispense  Refill  . cetirizine (ZYRTEC) 10 MG tablet   Oral   Take 10 mg by mouth daily.         . diphenhydrAMINE (SOMINEX) 25 MG tablet   Oral   Take 25 mg by mouth daily as needed for itching or sleep.         . naproxen sodium (ANAPROX) 220 MG tablet   Oral   Take 220 mg by mouth daily.         . QUEtiapine (SEROQUEL) 50 MG tablet   Oral   Take 50 mg by mouth 2 (two) times daily.           Allergies Review of patient's allergies indicates no known allergies.  No family history on file.  Social History Social History  Substance Use Topics  . Smoking status: Former Games developer  . Smokeless tobacco: None  . Alcohol Use: No    Review of Systems  Constitutional: Negative for fever. Eyes: Negative for visual changes. ENT: Negative for sore throat Cardiovascular: Negative for chest pain. Respiratory: Negative for shortness of breath. Gastrointestinal: Negative for abdominal pain, vomiting and diarrhea. Genitourinary: Negative for dysuria. Musculoskeletal: Negative for back pain. Skin:  Negative for rash. Positive for lacerations Neurological: Negative for headaches or focal weakness     ____________________________________________   PHYSICAL EXAM:  VITAL SIGNS: ED Triage Vitals  Enc Vitals Group     BP 11/26/14 0850 140/118 mmHg     Pulse Rate 11/26/14 0850 148     Resp 11/26/14 0850 18     Temp 11/26/14 0850 98.2 F (36.8 C)     Temp Source 11/26/14 0850 Oral     SpO2 11/26/14 0850 93 %     Weight 11/26/14 0850 237 lb (107.502 kg)     Height 11/26/14 0850  (1.854 m)     Head Cir --      Peak Flow --      Pain Score --      Pain Loc --      Pain Edu? --      Excl. in GC? --      Constitutional: Alert .Well appearing and in no distress. Eyes: Conjunctivae are normal.  ENT   Head: Normocephalic. Approximately 5 cm laceration to the left side of the forehead at the hairline which is linear   Mouth/Throat: Mucous membranes are moist. Cardiovascular: Normal rate, regular rhythm. Normal and symmetric distal pulses are present in all extremities. No murmurs, rubs, or gallops. Respiratory: Normal respiratory effort  without tachypnea nor retractions. Breath sounds are clear and equal bilaterally.  Gastrointestinal: Soft and non-tender in all quadrants. No distention. There is no CVA tenderness. Genitourinary: deferred Musculoskeletal: Nontender with normal range of motion in all extremities. Patient with curvilinear 1 cm shallow laceration to the right third finger, normal tendon function, bleeding controlled. 2+ cap refill Neurologic:  Normal speech and language. No gross focal neurologic deficits are appreciated. Skin:  Skin is warm, dry. 2 lacerations as noted above Psychiatric: Mood and affect are normal.   ____________________________________________    LABS (pertinent positives/negatives)  Labs Reviewed  CBC - Abnormal; Notable for the following:    RDW 15.2 (*)    Platelets 147 (*)    All other components within normal limits   COMPREHENSIVE METABOLIC PANEL - Abnormal; Notable for the following:    CO2 21 (*)    Glucose, Bld 120 (*)    Calcium 8.5 (*)    Total Protein 8.3 (*)    Albumin 3.1 (*)    ALT 16 (*)    All other components within normal limits  TROPONIN I    ____________________________________________   EKG  ED ECG REPORT I, Jene Every, the attending physician, personally viewed and interpreted this ECG.   Date: 11/26/2014  EKG Time: 9:05 AM  Rate: 155  Rhythm: Supraventricular tachycardia  Axis: Normal  Intervals:none  ST&T Change: Nonspecific   ____________________________________________    RADIOLOGY I have personally reviewed any xrays that were ordered on this patient: CT head no acute distress Chest x-ray unremarkable  ____________________________________________   PROCEDURES  Procedure(s) performed: yes  1. LACERATION REPAIR Performed by: Jene Every Authorized by: Jene Every Consent: Verbal consent obtained. Risks and benefits: risks, benefits and alternatives were discussed Consent given by: patient Patient identity confirmed: provided demographic data Prepped and Draped in normal sterile fashion Wound explored  Laceration Location: Scalp  Laceration Length: 5cm  No Foreign Bodies seen or palpated   Irrigation method: syringe Amount of cleaning: standard  Skin closure: Staples   Technique: 4 staples used   Patient tolerance: Patient tolerated the procedure well with no immediate complications.  2. LACERATION REPAIR Performed by: Jene Every Authorized by: Jene Every Consent: Verbal consent obtained. Risks and benefits: risks, benefits and alternatives were discussed Consent given by: patient Patient identity confirmed: provided demographic data Prepped and Draped in normal sterile fashion Wound explored  Laceration Location:right third finger Laceration Length: 1 cm  No Foreign Bodies seen or palpated  Irrigation  method: syringe Amount of cleaning: standard  Skin closure: skin glue  Finger splint applied to allow wound to heal.   Patient tolerance: Patient tolerated the procedure well with no immediate complications.   Critical Care performed: yes  CRITICAL CARE Performed by: Jene Every   Total critical care time: 30 min  Critical care time was exclusive of separately billable procedures and treating other patients.  Critical care was necessary to treat or prevent imminent or life-threatening deterioration.  Critical care was time spent personally by me on the following activities: development of treatment plan with patient and/or surrogate as well as nursing, discussions with consultants, evaluation of patient's response to treatment, examination of patient, obtaining history from patient or surrogate, ordering and performing treatments and interventions, ordering and review of laboratory studies, ordering and review of radiographic studies, pulse oximetry and re-evaluation of patient's condition.   ____________________________________________   INITIAL IMPRESSION / ASSESSMENT AND PLAN / ED COURSE  Pertinent labs & imaging results that were available  during my care of the patient were reviewed by me and considered in my medical decision making (see chart for details).  We were initially told the patient's fall was mechanical, however on the monitor patient has had intermittent episodes of significant tachycardia which appears to be superventricular. He denies chest pain. We will obtain IV, labs, chest x-ray and EKG.  ----------------------------------------- 9:10 AM on 11/26/2014 -----------------------------------------  EKG shows supraventricular tachycardia however patient has now reverted to normal sinus rhythm  After proximally 15 minutes patient again went into superventricular tachycardia at this time we gave 2.5 mg of metoprolol which improved his heart  rate.  ----------------------------------------- 11:03 AM on 11/26/2014 -----------------------------------------  Patient again went into supra-ventricular tachycardia while I was repairing his wounds but went back to normal sinus rhythm without intervention  ----------------------------------------- 11:20 AM on 11/26/2014 -----------------------------------------  Dr. Gwen Pounds of [facility] recommends metoprolol po and telemetry. He will see the patient  ____________________________________________   FINAL CLINICAL IMPRESSION(S) / ED DIAGNOSES  Final diagnoses:  Supraventricular tachycardia (HCC)  Scalp laceration, initial encounter  Fall, initial encounter  Finger laceration, initial encounter     Jene Every, MD 11/26/14 1133

## 2014-11-26 NOTE — Evaluation (Signed)
Physical Therapy Evaluation Patient Details Name: Nicholas [name] MRN: [id number] DOB: 11/28/1931 Today's Date: 11/26/2014   History of Present Illness  Patient is an 81 y/o male that presents after a fall, where he hit his head. In ER patient found to be in atrial tachycardia and RVR, though he was asymptomatic. Patient with history of Alzheimer's dementia.   Clinical Impression  Patient presents after fall and atrial tachycardia noted in the ER. Patient appeared to have HR controlled during this session maintaining in the 90s throughout ambulation, however he had uncontrolled stool leading to significant time being spent on clean up. Patient is very persverative asking the same questions repeatedly and is very impulsive with little to no ability to follow commands for more than 5-10 seconds at a time. While he does not display any obvious balance deficits, he is very impulsive and demonstrates no safety awareness. No caregiver present to provide history, however at this stage patient appears very difficult to [facility] after and may be appropriate for a memory care unit given his level of dementia. From a mobility standpoint he appears at or very near his baseline and would benefit from continued ambulation and balance training, it is unclear how much carryover there would be as patient does not consistently follow any commands or demonstrate any learning/short term memory in this session.     Follow Up Recommendations No PT follow up    Equipment Recommendations       Recommendations for Other Services       Precautions / Restrictions Precautions Precautions: Fall Restrictions Weight Bearing Restrictions: No      Mobility  Bed Mobility Overal bed mobility: Independent             General bed mobility comments: Patient transferred supine <--> sit independently. He is able to scoot in the bed independently.   Transfers Overall transfer level: Needs assistance    Transfers: Sit to/from Stand Sit to Stand: Supervision         General transfer comment: Patient is very impulsive and mild balance deficit with standing noted.   Ambulation/Gait Ambulation/Gait assistance: Supervision Ambulation Distance (Feet): 100 Feet       Gait velocity interpretation: at or above normal speed for age/gender General Gait Details: Patient requires constant cuing for directions and safety as he is very impulsive. He reaches out for hand hold assistance at multiple times for balance assistance.   Stairs            Wheelchair Mobility    Modified Rankin (Stroke Patients Only)       Balance Overall balance assessment: Needs assistance   Sitting balance-Leahy Scale: Normal Sitting balance - Comments: No balance deficits in sitting noted.      Standing balance-Leahy Scale: Fair Standing balance comment: Unable to perform formal balance test as patient was extremely impulsive and does not follow commands.                              Pertinent Vitals/Pain Pain Assessment: No/denies pain    Home Living Family/patient expects to be discharged to:: Private residence Living Arrangements: Spouse/significant other                    Prior Function Level of Independence: Independent         Comments: Patient is unable to provide any additional information.      Hand Dominance  Extremity/Trunk Assessment   Upper Extremity Assessment: Overall WFL for tasks assessed           Lower Extremity Assessment: Overall WFL for tasks assessed      Cervical / Trunk Assessment: Normal  Communication   Communication: No difficulties  Cognition Arousal/Alertness: Awake/alert Behavior During Therapy: Anxious;Restless Overall Cognitive Status: History of cognitive impairments - at baseline (Patient is very perseverative, repeating the same questions multiple times throughout the session.)       Memory: Decreased  short-term memory              General Comments General comments (skin integrity, edema, etc.): Patient soiled his finger pad trying to clean himself, PT had patient wash hands and informed RN. Lacerations to left forehead and R middle finger.     Exercises Other Exercises Other Exercises: Significant time spent cleaning patient and switching gowns x 2 after toilet transfers.       Assessment/Plan    PT Assessment Patient needs continued PT services  PT Diagnosis Difficulty walking   PT Problem List Decreased strength;Decreased balance;Decreased safety awareness;Decreased knowledge of precautions  PT Treatment Interventions DME instruction;Gait training;Therapeutic exercise;Therapeutic activities;Balance training   PT Goals (Current goals can be found in the Care Plan section) Acute Rehab PT Goals PT Goal Formulation: Patient unable to participate in goal setting Time For Goal Achievement: 12/10/14 Potential to Achieve Goals: Good    Frequency Min 2X/week   Barriers to discharge   Patient appears very difficult to manage and does not follow instructions. No obvious balance deficits noted.    Co-evaluation               End of Session Equipment Utilized During Treatment: Gait belt Activity Tolerance: Patient tolerated treatment well Patient left: in bed;with bed alarm set;with call bell/phone within reach Nurse Communication: Mobility status;Precautions    Functional Assessment Tool Used: Clinical judgement  Functional Limitation: Mobility: Walking and moving around Mobility: Walking and Moving Around Current Status (Z6109): At least 1 percent but less than 20 percent impaired, limited or restricted Mobility: Walking and Moving Around Goal Status [phone]): At least 1 percent but less than 20 percent impaired, limited or restricted    Time: 1350-1426 PT Time Calculation (min) (ACUTE ONLY): 36 min   Charges:   PT Evaluation $Initial PT Evaluation Tier I: 1  Procedure PT Treatments $Therapeutic Activity: 8-22 mins   PT G Codes:   PT G-Codes **NOT FOR INPATIENT CLASS** Functional Assessment Tool Used: Clinical judgement  Functional Limitation: Mobility: Walking and moving around Mobility: Walking and Moving Around Current Status (U9811): At least 1 percent but less than 20 percent impaired, limited or restricted Mobility: Walking and Moving Around Goal Status [phone]): At least 1 percent but less than 20 percent impaired, limited or restricted    Kerin Ransom, PT, DPT    11/26/2014, 4:30 PM

## 2014-11-26 NOTE — H&P (Signed)
[facility] - [place] at [facility]   PATIENT NAME: Nicholas [name]    MR#:  [id number]  DATE OF BIRTH:  [birth year]  DATE OF ADMISSION:  11/26/2014  PRIMARY CARE PHYSICIAN: Sula Rumple, MD   REQUESTING/REFERRING PHYSICIAN: Dr. Jene Every  CHIEF COMPLAINT:   Chief Complaint  Patient presents with  . Fall   SVT  HISTORY OF PRESENT ILLNESS:  Nicholas [name]  is a 81 y.o. male with a known history of Alzheimer's dementia, peripheral vascular disease status post stents who presents to the hospital due to a fall/syncope. Patient himself has dementia and therefore most of the history obtained from the wife at bedside. As per the wife patient had a fall in his bathroom today and cannot recall any of these events. He presented to the ER and had a laceration to his head and his right hand which were sutured by the emergency room physician. Patient on the emergency room at into SVT with heart rates in the 150s. He was given a dose of oral metoprolol and currently lives in normal sinus rhythm. There is some concern that this possibly could be a syncopal episode related to SVT and therefore hospitalist services were contacted for further treatment and evaluation.  PAST MEDICAL HISTORY:   Past Medical History  Diagnosis Date  . Alzheimer disease     PAST SURGICAL HISTORY:   Past Surgical History  Procedure Laterality Date  . Coronary angioplasty with stent placement      SOCIAL HISTORY:   Social History  Substance Use Topics  . Smoking status: Former Games developer  . Smokeless tobacco: Not on file  . Alcohol Use: No    FAMILY HISTORY:   Family History  Problem Relation Age of Onset  . CVA Mother   . Peripheral vascular disease Father   . Diabetes Father   . Heart attack Brother     DRUG ALLERGIES:  No Known Allergies  REVIEW OF SYSTEMS:   Review of Systems  Unable to perform ROS  due to patient's dementia  MEDICATIONS AT HOME:   Prior to  Admission medications   Medication Sig Start Date End Date Taking? Authorizing Provider  cetirizine (ZYRTEC) 10 MG tablet Take 10 mg by mouth daily.   Yes Historical Provider, MD  diphenhydrAMINE (SOMINEX) 25 MG tablet Take 25 mg by mouth daily as needed for itching or sleep.   Yes Historical Provider, MD  naproxen sodium (ANAPROX) 220 MG tablet Take 220 mg by mouth daily.   Yes Historical Provider, MD  QUEtiapine (SEROQUEL) 50 MG tablet Take 50 mg by mouth 2 (two) times daily.   Yes Historical Provider, MD      VITAL SIGNS:  Blood pressure 130/85, pulse 79, temperature 98.2 F (36.8 C), temperature source Oral, resp. rate 15, height  (1.854 m), weight 107.502 kg (237 lb), SpO2 97 %.  PHYSICAL EXAMINATION:  Physical Exam  GENERAL:  81 y.o.-year-old patient lying in the bed with no acute distress.  EYES: Pupils equal, round, reactive to light and accommodation. No scleral icterus. Extraocular muscles intact.  HEENT: Laceration to the head which is sutured. Oropharynx and nasopharynx clear. No oropharyngeal erythema, moist oral mucosa  NECK:  Supple, no jugular venous distention. No thyroid enlargement, no tenderness.  LUNGS: Normal breath sounds bilaterally, no wheezing, rales, rhonchi. No use of accessory muscles of respiration.  CARDIOVASCULAR: S1, S2 RRR. No murmurs, rubs, gallops, clicks.  ABDOMEN: Soft, nontender, nondistended. Bowel sounds present. No organomegaly or mass.  EXTREMITIES: No pedal edema, cyanosis, or clubbing. + 2 pedal & radial pulses b/l.   NEUROLOGIC: Cranial nerves II through XII are intact. No focal Motor or sensory deficits appreciated b/l.  Globally weak PSYCHIATRIC: The patient is alert and oriented x 2. Good affect.  SKIN: No obvious rash, lesion, or ulcer.   LABORATORY PANEL:   CBC  Recent Labs Lab 11/26/14 0903  WBC 5.8  HGB 14.9  HCT 45.5  PLT 147*    ------------------------------------------------------------------------------------------------------------------  Chemistries   Recent Labs Lab 11/26/14 0903  NA 136  K 4.0  CL 108  CO2 21*  GLUCOSE 120*  BUN 13  CREATININE 0.85  CALCIUM 8.5*  AST 33  ALT 16*  ALKPHOS 100  BILITOT 1.2   ------------------------------------------------------------------------------------------------------------------  Cardiac Enzymes  Recent Labs Lab 11/26/14 0903  TROPONINI <0.03   ------------------------------------------------------------------------------------------------------------------  RADIOLOGY:  Ct Head Wo Contrast  11/26/2014   CLINICAL DATA:  Status post fall. Left head laceration. No loss of consciousness. Initial encounter.  EXAM: CT HEAD WITHOUT CONTRAST  TECHNIQUE: Contiguous axial images were obtained from the base of the skull through the vertex without intravenous contrast.  COMPARISON:  Neck CT 03/29/2006.  FINDINGS: There is no evidence of acute intracranial hemorrhage, mass lesion, brain edema or extra-axial fluid collection. The ventricles and subarachnoid spaces are diffusely prominent, consistent with moderate atrophy. There is no CT evidence of acute cortical infarction. There are mild chronic small vessel ischemic changes within the periventricular and subcortical white matter bilaterally.  The visualized paranasal sinuses, mastoid air cells and middle ears are clear. The calvarium is intact. There is mild soft tissue swelling in the left frontoparietal scalp.  IMPRESSION: 1. Left scalp soft tissue injury. No acute intracranial or calvarial findings. 2. Moderate atrophy and mild chronic small vessel ischemic changes.   Electronically Signed   By: Carey Bullocks M.D.   On: 11/26/2014 10:09   Dg Chest Portable 1 View  11/26/2014   CLINICAL DATA:  Pain following fall  EXAM: PORTABLE CHEST 1 VIEW  COMPARISON:  June 17, 2009  FINDINGS: There is no edema or  consolidation. There is stable lateral apical pleural thickening on the left. The heart size and pulmonary vascularity are normal. No adenopathy. There is atherosclerotic change the aortic arch region. No bone lesions are appreciable.  IMPRESSION: No edema or consolidation.   Electronically Signed   By: Bretta Bang III M.D.   On: 11/26/2014 09:33     IMPRESSION AND PLAN:   81 year old male with past medical history of Alzheimer's dementia, peripheral vascular disease who presents to the hospital after a fall/syncope and noted to be in SVT.  #1 SVT-the etiology of this is unclear. There is no acute electrolyte abnormalities. Patient has no history of structural heart disease. -We'll observe on telemetry, place the patient on oral beta blockers. We'll check a two-dimensional echocardiogram, get a cardiology consult.  #2 status post fall-unclear this was a syncope that led to the fall. Patient CT head is negative for any acute pathology. He is a poor historian given his Alzheimer's dementia. -We'll get a physical therapy consult to assess his mobility.  #3 history of dementia without behavioral disturbance-continue Seroquel.   All the records are reviewed and case discussed with ED provider. Management plans discussed with the patient, family and they are in agreement.  CODE STATUS: Full  TOTAL TIME TAKING CARE OF THIS PATIENT: 45 minutes.    Houston Siren M.D on 11/26/2014 at 11:58 AM  Between  7am to 6pm - Pager - [phone]  After 6pm go to [email or web address] - password [email or web address] [facility]  [facility] [place] [facility]  Office  [phone]  CC: Primary care physician; Sula Rumple, MD

## 2014-11-27 LAB — BASIC METABOLIC PANEL
ANION GAP: 7 (ref 5–15)
BUN: 15 mg/dL (ref 6–20)
CHLORIDE: 107 mmol/L (ref 101–111)
CO2: 25 mmol/L (ref 22–32)
Calcium: 8.5 mg/dL — ABNORMAL LOW (ref 8.9–10.3)
Creatinine, Ser: 0.82 mg/dL (ref 0.61–1.24)
GFR calc Af Amer: >60 mL/min (ref >60)
Glucose, Bld: 94 mg/dL (ref 65–99)
POTASSIUM: 4.1 mmol/L (ref 3.5–5.1)
SODIUM: 139 mmol/L (ref 135–145)

## 2014-11-27 LAB — CBC
HEMATOCRIT: 45.3 % (ref 40.0–52.0)
HEMOGLOBIN: 14.9 g/dL (ref 13.0–18.0)
MCH: 28.6 pg (ref 26.0–34.0)
MCHC: 32.8 g/dL (ref 32.0–36.0)
MCV: 87.1 fL (ref 80.0–100.0)
Platelets: 160 10*3/uL (ref 150–440)
RBC: 5.2 MIL/uL (ref 4.40–5.90)
RDW: 15.6 % — ABNORMAL HIGH (ref 11.5–14.5)
WBC: 7.7 10*3/uL (ref 3.8–10.6)

## 2014-11-27 MED ORDER — METOPROLOL TARTRATE 25 MG PO TABS: 25.0000 mg | ORAL_TABLET | Freq: Two times a day (BID) | ORAL | Status: AC

## 2014-11-27 MED ORDER — METOPROLOL TARTRATE 1 MG/ML IV SOLN
2.5000 mg | Freq: Once | INTRAVENOUS | Status: DC
  Filled 2014-11-27: qty 5

## 2014-11-27 NOTE — Progress Notes (Signed)
Patient was A&O X1 (person and date of birth). He was calm and cooperative with occasional impulsive behavior. He has a Comptroller at bedside till 23:00.Patient continues to have occasional asymptomatic ST. Patient has no acute event overnight he rested well. The bed was kept low and bed alarm was on. Patient was offered bathroom with every safety checks. Will continue monitor.

## 2014-11-27 NOTE — Progress Notes (Signed)
[facility] Encounter Note  Patient: Nicholas [name] / Admit Date: 11/26/2014 / Date of Encounter: 11/27/2014, 7:41 AM   Subjective: No new trouble overnight Patient has had maintenance of normal sinus rhythm No evidence of syncope or heart block  Review of Systems:  Unable to express true review of systems Objective: Telemetry: Normal sinus rhythm Physical Exam: Blood pressure 112/75, pulse 147, temperature 97.6 F (36.4 C), temperature source Oral, resp. rate 18, height  (1.854 m), weight 237 lb (107.502 kg), SpO2 90 %. Body mass index is 31.27 kg/(m^2). General: Well developed, well nourished, in no acute distress. Head: Normocephalic, atraumatic, sclera non-icteric, no xanthomas, nares are without discharge. Neck: No apparent masses Lungs: Normal respirations with no wheezes, no rhonchi, no rales , no crackles   Heart: Regular rate and rhythm, normal S1 S2, 2+ aortic murmur, no rub, no gallop, PMI is normal size and placement, carotid upstroke normal without bruit, jugular venous pressure normal Abdomen: Soft, non-tender, non-distended with normoactive bowel sounds. No hepatosplenomegaly. Abdominal aorta is normal size without bruit Extremities: No edema, no clubbing, no cyanosis, no ulcers,  Peripheral: 2+ radial, 2+ femoral, 2+ dorsal pedal pulses Neuro: Alert and oriented. Moves all extremities spontaneously. Psych:  Responds to questions appropriately with a normal affect.   Intake/Output Summary (Last 24 hours) at 11/27/14 0741 Last data filed at 11/27/14 0635  Gross per 24 hour  Intake    120 ml  Output    550 ml  Net   -430 ml    Inpatient Medications:  . heparin  5,000 Units Subcutaneous 3 times per day  . loratadine  10 mg Oral Daily  . metoprolol  2.5 mg Intravenous Once  . metoprolol tartrate  25 mg Oral BID  . QUEtiapine  50 mg Oral BID  . sodium chloride  3 mL Intravenous Q12H   Infusions:    Labs:  Recent Labs   11/26/14 0903 11/27/14 0424  NA 136 139  K 4.0 4.1  CL 108 107  CO2 21* 25  GLUCOSE 120* 94  BUN 13 15  CREATININE 0.85 0.82  CALCIUM 8.5* 8.5*    Recent Labs  11/26/14 0903  AST 33  ALT 16*  ALKPHOS 100  BILITOT 1.2  PROT 8.3*  ALBUMIN 3.1*    Recent Labs  11/26/14 0903 11/27/14 0424  WBC 5.8 7.7  HGB 14.9 14.9  HCT 45.5 45.3  MCV 86.8 87.1  PLT 147* 160    Recent Labs  11/26/14 0903 11/26/14 1323 11/26/14 1815  TROPONINI <0.03 <0.03 <0.03   Invalid input(s): POCBNP No results for input(s): HGBA1C in the last 72 hours.   Weights: Filed Weights   11/26/14 0850  Weight: 237 lb (107.502 kg)     Radiology/Studies:  Ct Head Wo Contrast  11/26/2014   CLINICAL DATA:  Status post fall. Left head laceration. No loss of consciousness. Initial encounter.  EXAM: CT HEAD WITHOUT CONTRAST  TECHNIQUE: Contiguous axial images were obtained from the base of the skull through the vertex without intravenous contrast.  COMPARISON:  Neck CT 03/29/2006.  FINDINGS: There is no evidence of acute intracranial hemorrhage, mass lesion, brain edema or extra-axial fluid collection. The ventricles and subarachnoid spaces are diffusely prominent, consistent with moderate atrophy. There is no CT evidence of acute cortical infarction. There are mild chronic small vessel ischemic changes within the periventricular and subcortical white matter bilaterally.  The visualized paranasal sinuses, mastoid air cells and middle ears are clear. The  calvarium is intact. There is mild soft tissue swelling in the left frontoparietal scalp.  IMPRESSION: 1. Left scalp soft tissue injury. No acute intracranial or calvarial findings. 2. Moderate atrophy and mild chronic small vessel ischemic changes.   Electronically Signed   By: Carey Bullocks M.D.   On: 11/26/2014 10:09   Dg Chest Portable 1 View  11/26/2014   CLINICAL DATA:  Pain following fall  EXAM: PORTABLE CHEST 1 VIEW  COMPARISON:  June 17, 2009   FINDINGS: There is no edema or consolidation. There is stable lateral apical pleural thickening on the left. The heart size and pulmonary vascularity are normal. No adenopathy. There is atherosclerotic change the aortic arch region. No bone lesions are appreciable.  IMPRESSION: No edema or consolidation.   Electronically Signed   By: Bretta Bang III M.D.   On: 11/26/2014 09:33     Assessment and Recommendation  81 y.o. male with a mechanical fall of unknown etiology with injury to his head and incidental finding of supraventricular tachycardia which may be involved. The patient was placed on metoprolol with good result of the maintenance of normal sinus rhythm and no further episodes of issues. There is been no evidence of myocardial infarction 1. Continue metoprolol for maintenance of normal sinus rhythm 2. Begin ambulation and follow for need for assessment of unsteadiness or causes of mechanical fall 3. No further cardiac diagnostics necessary at this time 4. Okay for discharge home from cardiac standpoint with follow-up next week for adjustments of medication management  Signed, Arnoldo Hooker M.D. FACC

## 2014-11-27 NOTE — Discharge Summary (Signed)
[facility] - [place] at [facility]   PATIENT NAME: Nicholas [name]    MR#:  [id number]  DATE OF BIRTH:  15-Sep-1931  DATE OF ADMISSION:  11/26/2014 ADMITTING PHYSICIAN: Houston Siren, MD  DATE OF DISCHARGE: 11/27/2014 11:22 AM  PRIMARY CARE PHYSICIAN: Sula Rumple, MD    ADMISSION DIAGNOSIS:  Supraventricular tachycardia (HCC) [I47.1] Finger laceration, initial encounter [S61.219A] Fall, initial encounter [W19.XXXA] Scalp laceration, initial encounter [S01.01XA]  DISCHARGE DIAGNOSIS:  Active Problems:   SVT (supraventricular tachycardia) (HCC)   SECONDARY DIAGNOSIS:   Past Medical History  Diagnosis Date  . Alzheimer disease     HOSPITAL COURSE:   81 year old male without murmurs dementia and peripheral vascular disease who presented after fall and noted to be in SVT. For further details refer the H&P. 1. SVT: Etiology unclear. Patient was seen by cardiology. They recommended metoprolol. Patient has no evidence of syncope or heart block. Patient has had maintenance of normal sinus rhythm. Dr. Waldron Session recommended continue metoprolol and follow up in his office for echocardiogram results.  2. Alzheimer's dementia without behavioral disturbance: Continue Seroquel.    DISCHARGE CONDITIONS AND DIET:  Patient was discharged in fair condition with hospice on a regular diet  CONSULTS OBTAINED:  Treatment Team:  Lamar Blinks, MD  DRUG ALLERGIES:  No Known Allergies  DISCHARGE MEDICATIONS:   Discharge Medication List as of 11/27/2014 11:05 AM    START taking these medications   Details  metoprolol tartrate (LOPRESSOR) 25 MG tablet Take 1 tablet (25 mg total) by mouth 2 (two) times daily., Starting 11/27/2014, Until Discontinued, Normal      CONTINUE these medications which have NOT CHANGED   Details  cetirizine (ZYRTEC) 10 MG tablet Take 10 mg by mouth daily., Until Discontinued, Historical Med    diphenhydrAMINE (SOMINEX) 25 MG  tablet Take 25 mg by mouth daily as needed for itching or sleep., Until Discontinued, Historical Med    naproxen sodium (ANAPROX) 220 MG tablet Take 220 mg by mouth daily., Until Discontinued, Historical Med    QUEtiapine (SEROQUEL) 50 MG tablet Take 50 mg by mouth 2 (two) times daily., Until Discontinued, Historical Med              Today   CHIEF COMPLAINT:  Patient has dementia. No acute events overnight. Telemetry shows normal sinus rhythm   VITAL SIGNS:  Blood pressure 132/85, pulse 58, temperature 97.6 F (36.4 C), temperature source Oral, resp. rate 18, height  (1.854 m), weight 107.502 kg (237 lb), SpO2 97 %.   REVIEW OF SYSTEMS:  Review of Systems  Unable to perform ROS: dementia     PHYSICAL EXAMINATION:  GENERAL:  81 y.o.-year-old patient lying in the bed with no acute distress.  NECK:  Supple, no jugular venous distention. No thyroid enlargement, no tenderness.  LUNGS: Normal breath sounds bilaterally, no wheezing, rales,rhonchi  No use of accessory muscles of respiration.  CARDIOVASCULAR: S1, S2 normal. No murmurs, rubs, or gallops.  ABDOMEN: Soft, non-tender, non-distended. Bowel sounds present. No organomegaly or mass.  EXTREMITIES: No pedal edema, cyanosis, or clubbing.  PSYCHIATRIC: The patient is alert and oriented to name SKIN: No obvious rash, lesion, or ulcer. He has a sling on his finger  DATA REVIEW:   CBC  Recent Labs Lab 11/27/14 0424  WBC 7.7  HGB 14.9  HCT 45.3  PLT 160    Chemistries   Recent Labs Lab 11/26/14 0903 11/27/14 0424  NA 136 139  K 4.0 4.1  CL 108 107  CO2 21* 25  GLUCOSE 120* 94  BUN 13 15  CREATININE 0.85 0.82  CALCIUM 8.5* 8.5*  AST 33  --   ALT 16*  --   ALKPHOS 100  --   BILITOT 1.2  --     Cardiac Enzymes  Recent Labs Lab 11/26/14 0903 11/26/14 1323 11/26/14 1815  TROPONINI <0.03 <0.03 <0.03    Microbiology Results  @  RADIOLOGY:  Ct Head Wo Contrast  11/26/2014    CLINICAL DATA:  Status post fall. Left head laceration. No loss of consciousness. Initial encounter.  EXAM: CT HEAD WITHOUT CONTRAST  TECHNIQUE: Contiguous axial images were obtained from the base of the skull through the vertex without intravenous contrast.  COMPARISON:  Neck CT 03/29/2006.  FINDINGS: There is no evidence of acute intracranial hemorrhage, mass lesion, brain edema or extra-axial fluid collection. The ventricles and subarachnoid spaces are diffusely prominent, consistent with moderate atrophy. There is no CT evidence of acute cortical infarction. There are mild chronic small vessel ischemic changes within the periventricular and subcortical white matter bilaterally.  The visualized paranasal sinuses, mastoid air cells and middle ears are clear. The calvarium is intact. There is mild soft tissue swelling in the left frontoparietal scalp.  IMPRESSION: 1. Left scalp soft tissue injury. No acute intracranial or calvarial findings. 2. Moderate atrophy and mild chronic small vessel ischemic changes.   Electronically Signed   By: Carey Bullocks M.D.   On: 11/26/2014 10:09   Dg Chest Portable 1 View  11/26/2014   CLINICAL DATA:  Pain following fall  EXAM: PORTABLE CHEST 1 VIEW  COMPARISON:  June 17, 2009  FINDINGS: There is no edema or consolidation. There is stable lateral apical pleural thickening on the left. The heart size and pulmonary vascularity are normal. No adenopathy. There is atherosclerotic change the aortic arch region. No bone lesions are appreciable.  IMPRESSION: No edema or consolidation.   Electronically Signed   By: Bretta Bang III M.D.   On: 11/26/2014 09:33      Management plans discussed with the patient's wife and she is in agreement. Stable for discharge home with hospice  Patient should follow up with cardiology in 1 week  CODE STATUS:     Code Status Orders        Start     Ordered   11/26/14 1309  Full code   Continuous     11/26/14 1309    Advance  Directive Documentation        Most Recent Value   Type of Advance Directive  Healthcare Power of Attorney, Living will   Pre-existing out of facility DNR order (yellow form or pink MOST form)     "MOST" Form in Place?        TOTAL TIME TAKING CARE OF THIS PATIENT: 35 minutes.    Harvard Zeiss M.D on 11/27/2014 at 11:29 AM  Between 7am to 6pm - Pager - [phone] After 6pm go to [email or web address] - password [email or web address] [facility]  [facility]  Office  [phone]  CC: Primary care physician; Sula Rumple, MD

## 2014-12-26 ENCOUNTER — Emergency Department
Admission: EM | Admit: 2014-12-26 | Discharge: 2015-01-02 | Disposition: A | Discharge status: Other Acute Inpatient Hospital | Attending: Emergency Medicine | Admitting: Emergency Medicine

## 2014-12-26 ENCOUNTER — Encounter: Payer: Self-pay | Admitting: Emergency Medicine

## 2014-12-26 DIAGNOSIS — G309 Alzheimer's disease, unspecified: Secondary | ICD-10-CM

## 2014-12-26 DIAGNOSIS — Z87891 Personal history of nicotine dependence: Secondary | ICD-10-CM | POA: Insufficient documentation

## 2014-12-26 DIAGNOSIS — I471 Supraventricular tachycardia, unspecified: Secondary | ICD-10-CM | POA: Diagnosis present

## 2014-12-26 DIAGNOSIS — Z79899 Other long term (current) drug therapy: Secondary | ICD-10-CM | POA: Diagnosis not present

## 2014-12-26 DIAGNOSIS — Z791 Long term (current) use of non-steroidal anti-inflammatories (NSAID): Secondary | ICD-10-CM | POA: Insufficient documentation

## 2014-12-26 DIAGNOSIS — F0390 Unspecified dementia without behavioral disturbance: Secondary | ICD-10-CM | POA: Insufficient documentation

## 2014-12-26 DIAGNOSIS — G308 Other Alzheimer's disease: Secondary | ICD-10-CM

## 2014-12-26 DIAGNOSIS — F911 Conduct disorder, childhood-onset type: Secondary | ICD-10-CM | POA: Diagnosis not present

## 2014-12-26 DIAGNOSIS — Z008 Encounter for other general examination: Secondary | ICD-10-CM | POA: Diagnosis present

## 2014-12-26 DIAGNOSIS — F0391 Unspecified dementia with behavioral disturbance: Secondary | ICD-10-CM

## 2014-12-26 DIAGNOSIS — F0281 Dementia in other diseases classified elsewhere with behavioral disturbance: Secondary | ICD-10-CM

## 2014-12-26 DIAGNOSIS — R4689 Other symptoms and signs involving appearance and behavior: Secondary | ICD-10-CM

## 2014-12-26 LAB — COMPREHENSIVE METABOLIC PANEL
ALBUMIN: 3 g/dL — AB (ref 3.5–5.0)
ALK PHOS: 117 U/L (ref 38–126)
ALT: 14 U/L — AB (ref 17–63)
AST: 31 U/L (ref 15–41)
Anion gap: 7 (ref 5–15)
BILIRUBIN TOTAL: 0.7 mg/dL (ref 0.3–1.2)
BUN: 17 mg/dL (ref 6–20)
CO2: 23 mmol/L (ref 22–32)
Calcium: 8.8 mg/dL — ABNORMAL LOW (ref 8.9–10.3)
Chloride: 107 mmol/L (ref 101–111)
Creatinine, Ser: 1.05 mg/dL (ref 0.61–1.24)
GFR calc Af Amer: >60 mL/min (ref >60)
GFR calc non Af Amer: >60 mL/min (ref >60)
GLUCOSE: 133 mg/dL — AB (ref 65–99)
POTASSIUM: 4.1 mmol/L (ref 3.5–5.1)
Sodium: 137 mmol/L (ref 135–145)
TOTAL PROTEIN: 8.3 g/dL — AB (ref 6.5–8.1)

## 2014-12-26 LAB — ETHANOL: Alcohol, Ethyl (B): <5 mg/dL (ref <5)

## 2014-12-26 LAB — CBC
HEMATOCRIT: 46.8 % (ref 40.0–52.0)
Hemoglobin: 15.2 g/dL (ref 13.0–18.0)
MCH: 28.7 pg (ref 26.0–34.0)
MCHC: 32.5 g/dL (ref 32.0–36.0)
MCV: 88.2 fL (ref 80.0–100.0)
Platelets: 192 10*3/uL (ref 150–440)
RBC: 5.3 MIL/uL (ref 4.40–5.90)
RDW: 16.5 % — AB (ref 11.5–14.5)
WBC: 7.9 10*3/uL (ref 3.8–10.6)

## 2014-12-26 LAB — URINE DRUG SCREEN, QUALITATIVE (ARMC ONLY)
AMPHETAMINES, UR SCREEN: NOT DETECTED
BENZODIAZEPINE, UR SCRN: NOT DETECTED
Barbiturates, Ur Screen: NOT DETECTED
Cannabinoid 50 Ng, Ur ~~LOC~~: NOT DETECTED
Cocaine Metabolite,Ur ~~LOC~~: NOT DETECTED
MDMA (ECSTASY) UR SCREEN: NOT DETECTED
Methadone Scn, Ur: NOT DETECTED
OPIATE, UR SCREEN: NOT DETECTED
PHENCYCLIDINE (PCP) UR S: NOT DETECTED
Tricyclic, Ur Screen: POSITIVE — AB

## 2014-12-26 LAB — ACETAMINOPHEN LEVEL: Acetaminophen (Tylenol), Serum: <10 ug/mL — ABNORMAL LOW (ref 10–30)

## 2014-12-26 LAB — SALICYLATE LEVEL: Salicylate Lvl: <4 mg/dL (ref 2.8–30.0)

## 2014-12-26 MED ORDER — QUETIAPINE FUMARATE 25 MG PO TABS
100.0000 mg | ORAL_TABLET | Freq: Every day | ORAL | Status: DC
  Administered 2014-12-26 – 2015-01-01 (×7): 100 mg via ORAL
  Filled 2014-12-26 (×7): qty 4

## 2014-12-26 MED ORDER — METOPROLOL TARTRATE 25 MG PO TABS
25.0000 mg | ORAL_TABLET | Freq: Two times a day (BID) | ORAL | Status: DC
  Administered 2014-12-26 – 2015-01-02 (×14): 25 mg via ORAL
  Filled 2014-12-26 (×15): qty 1

## 2014-12-26 NOTE — ED Notes (Signed)
Patient's wife here for visitation.
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
## 2014-12-26 NOTE — ED Notes (Signed)
Pt in room. No complaints or concerns voiced at this time. No abnormal behavior noted at this time. Will continue to monitor with q15 min checks. ODS officer in area. 
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
## 2014-12-26 NOTE — ED Notes (Signed)
BEHAVIORAL HEALTH ROUNDING Patient sleeping: No. Patient alert and oriented: yes Behavior appropriate: Yes.   Nutrition and fluids offered: Yes  Toileting and hygiene offered: Yes  Sitter present: q15 min observations Law enforcement present: Yes [employer]  ENVIRONMENTAL ASSESSMENT Potentially harmful objects out of patient reach: Yes.   Personal belongings secured: Yes.   Patient dressed in hospital provided attire only: Yes.   Plastic bags out of patient reach: Yes.   Patient care equipment (cords, cables, call bells, lines, and drains) shortened, removed, or accounted for: Yes.   Equipment and supplies removed from bottom of stretcher: Yes.   Potentially toxic materials out of patient reach: Yes.   Sharps container removed or out of patient reach: Yes.   ED BHU PLACEMENT JUSTIFICATION Is the patient under IVC or is there intent for IVC: Yes.    Is IVC current? Yes Is the patient medically cleared: Yes.   Is there vacancy in the ED BHU: Yes.   Is the population mix appropriate for patient: No, pt is high fall risk, and requires assistance with ADLs. Is the patient awaiting placement in inpatient or outpatient setting: unknown at this time.   Has the patient had a psychiatric consult: Yes.   Survey of unit performed for contraband, proper placement and condition of furniture, tampering with fixtures in bathroom, shower, and each patient room: Yes.  ; Findings: none APPEARANCE/BEHAVIOR Cooperative,calm NEURO ASSESSMENT Orientation: person Hallucinations: none noted at this time Speech: Normal Gait: pt has not ambulated at this time. RESPIRATORY ASSESSMENT Breathing Pattern-regular, no respiratory distress noted CARDIOVASCULAR ASSESSMENT Skin color appropriate for age and race GASTROINTESTINAL ASSESSMENT no GI distress noted EXTREMITIES Moves all extremities, no distress noted PLAN OF CARE Provide calm/safe environment. Vital signs assessed twice daily. ED BHU Assessment  once each 12-hour shift. Collaborate with intake RN daily or as condition indicates. Assure the ED provider has rounded once each shift. Provide and encourage hygiene. Provide redirection as needed. Assess for escalating behavior; address immediately and inform ED provider.  Assess family dynamic and appropriateness for visitation as needed: Yes.  Educate the patient/family about BHU procedures/visitation: Yes.

## 2014-12-26 NOTE — ED Notes (Signed)
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
## 2014-12-26 NOTE — ED Notes (Signed)
[facility]  [facility] room. Pt complaining about roommate states "he needs to get out of here I can't sleep" I discussed with pt at this time there is no place to move wither pt or roommate to. No abnormal behavior noted at this time. Will continue to monitor with q15 min checks. [employer] officer in area.

## 2014-12-26 NOTE — ED Notes (Signed)
BEHAVIORAL HEALTH ROUNDING Patient sleeping: Yes.   Patient alert and oriented: not applicable Behavior appropriate: Yes.    Nutrition and fluids offered: No Toileting and hygiene offered: No Sitter present: q15 minute observations Law enforcement present: Yes [employer] 

## 2014-12-26 NOTE — ED Notes (Signed)
BEHAVIORAL HEALTH ROUNDING Patient sleeping: No. Patient alert and oriented: yes Behavior appropriate: Yes.  ; If no, describe:  Nutrition and fluids offered: Yes  Toileting and hygiene offered: Yes  Sitter present: not applicable Law enforcement present: No   
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
## 2014-12-27 MED ORDER — ACETAMINOPHEN 325 MG PO TABS
650.0000 mg | ORAL_TABLET | Freq: Once | ORAL | Status: AC
  Administered 2014-12-27: 650 mg via ORAL
  Filled 2014-12-27: qty 2

## 2014-12-27 NOTE — ED Notes (Signed)
Pt in room. No complaints or concerns voiced at this time. No abnormal behavior noted at this time. Will continue to monitor with q15 min checks. ODS officer in area. 
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
## 2014-12-27 NOTE — ED Notes (Signed)
BEHAVIORAL HEALTH ROUNDING Patient sleeping: Yes.   Patient alert and oriented: not applicable Behavior appropriate: Yes.    Nutrition and fluids offered: No Toileting and hygiene offered: No Sitter present: q15 minute observations Law enforcement present: Yes [employer] 

## 2014-12-27 NOTE — ED Notes (Signed)
BEHAVIORAL HEALTH ROUNDING Patient sleeping: Yes.   Patient alert and oriented: yes Behavior appropriate: Yes.  ; If no, describe:  Nutrition and fluids offered: Yes  Toileting and hygiene offered: Yes  Sitter present: yes Law enforcement present: Yes  
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
## 2014-12-27 NOTE — ED Notes (Signed)
BEHAVIORAL HEALTH ROUNDING Patient sleeping: Yes.   Patient alert and oriented: not applicable Behavior appropriate: No.; If no, describe:  Nutrition and fluids offered: Yes  Toileting and hygiene offered: Yes  Sitter present: no Law enforcement present: Yes

## 2014-12-27 NOTE — ED Notes (Signed)
BEHAVIORAL HEALTH ROUNDING Patient sleeping: No. Patient alert and oriented: no Behavior appropriate: Yes.  ; If no, describe:  Nutrition and fluids offered: Yes  Toileting and hygiene offered: Yes  Sitter present: no Law enforcement present: Yes  

## 2014-12-27 NOTE — ED Notes (Signed)

## 2014-12-27 NOTE — ED Notes (Signed)
Spilled urinal on bed, linens changed

## 2014-12-27 NOTE — ED Notes (Signed)
BEHAVIORAL HEALTH ROUNDING Patient sleeping: Yes.   Patient alert and oriented: no Behavior appropriate: Yes.  ; If no, describe:  Nutrition and fluids offered: Yes  Toileting and hygiene offered: Yes  Sitter present: no Law enforcement present: Yes  

## 2014-12-27 NOTE — ED Provider Notes (Signed)
-----------------------------------------   6:47 AM on 12/27/2014 -----------------------------------------   Blood pressure 151/108, pulse 115, temperature 97.8 F (36.6 C), temperature source Oral, resp. rate 18, height 6\' 1"  (1.854 m), weight 231 lb 7.7 oz (105 kg), SpO2 97 %.  The patient had no acute events since last update.  Calm and cooperative at this time.  Disposition is pending per Psychiatry/Behavioral Medicine team recommendations.     Irean HongJade J Sung, MD 12/27/14 [phone]

## 2014-12-27 NOTE — ED Notes (Signed)
BEHAVIORAL HEALTH ROUNDING Patient sleeping: No. Patient alert and oriented: yes Behavior appropriate: Yes.   Nutrition and fluids offered: Yes  Toileting and hygiene offered: Yes  Sitter present: q15 min observations  Law enforcement present: Yes [employer]  ENVIRONMENTAL ASSESSMENT Potentially harmful objects out of patient reach: Yes.   Personal belongings secured: Yes.   Patient dressed in hospital provided attire only: Yes.   Plastic bags out of patient reach: Yes.   Patient care equipment (cords, cables, call bells, lines, and drains) shortened, removed, or accounted for: Yes.   Equipment and supplies removed from bottom of stretcher: Yes.   Potentially toxic materials out of patient reach: Yes.   Sharps container removed or out of patient reach: Yes.    ED BHU PLACEMENT JUSTIFICATION Is the patient under IVC or is there intent for IVC: Yes.    Is IVC current? Yes Is the patient medically cleared: Yes.   Is there vacancy in the ED BHU: Yes.   Is the population mix appropriate for patient: No, Pt with shuffling gait ,is high fall risk. Requires assistance with ADLs Is the patient awaiting placement in inpatient or outpatient setting: Yes.   Has the patient had a psychiatric consult: Yes.   Survey of unit performed for contraband, proper placement and condition of furniture, tampering with fixtures in bathroom, shower, and each patient room: Yes.  ; Findings: none APPEARANCE/BEHAVIOR Cooperative,calm NEURO ASSESSMENT Orientation: person Hallucinations: none noted at this time Speech: Normal Gait: shuffling RESPIRATORY ASSESSMENT Breathing Pattern-regular, no respiratory distress noted CARDIOVASCULAR ASSESSMENT Skin color appropriate for age and race GASTROINTESTINAL ASSESSMENT no GI distress noted EXTREMITIES Moves all extremities, no distress noted PLAN OF CARE Provide calm/safe environment. Vital signs assessed twice daily. ED BHU Assessment once each 12-hour  shift. Collaborate with intake RN daily or as condition indicates. Assure the ED provider has rounded once each shift. Provide and encourage hygiene. Provide redirection as needed. Assess for escalating behavior; address immediately and inform ED provider.  Assess family dynamic and appropriateness for visitation as needed: Yes.  Educate the patient/family about BHU procedures/visitation: Yes.

## 2014-12-27 NOTE — ED Notes (Signed)
Pt needs help with meals, will say no a couple of times then he eats

## 2014-12-27 NOTE — BHH Counselor (Signed)
Per Dr. Toni Amendlapacs, pt meets criteria for inpatient hospitalization.  [facility], [facility], and [place].

## 2014-12-27 NOTE — ED Notes (Signed)
Report received from RN Kathy C.  Pt in room. No complaints or concerns voiced at this time. No abnormal behavior noted at this time. Will continue to monitor with q15 min checks. ODS officer in area. 

## 2014-12-27 NOTE — ED Notes (Signed)
Patient assigned to appropriate care area. Patient oriented to unit/care area: Informed that, for their safety, care areas are designed for safety and monitored by security cameras at all times; and visiting hours explained to patient. Patient verbalizes understanding, and verbal contract for safety obtained. 

## 2014-12-28 ENCOUNTER — Emergency Department

## 2014-12-28 LAB — URINALYSIS COMPLETE WITH MICROSCOPIC (ARMC ONLY)
BACTERIA UA: NONE SEEN
Bilirubin Urine: NEGATIVE
Glucose, UA: NEGATIVE mg/dL
NITRITE: NEGATIVE
PH: 5 (ref 5.0–8.0)
PROTEIN: 30 mg/dL — AB
SPECIFIC GRAVITY, URINE: 1.021 (ref 1.005–1.030)

## 2014-12-28 MED ORDER — OXYCODONE-ACETAMINOPHEN 5-325 MG PO TABS
1.0000 | ORAL_TABLET | Freq: Once | ORAL | Status: AC
  Administered 2014-12-28: 1 via ORAL

## 2014-12-28 MED ORDER — ACETAMINOPHEN 325 MG PO TABS
650.0000 mg | ORAL_TABLET | Freq: Once | ORAL | Status: AC
  Administered 2014-12-28: 650 mg via ORAL

## 2014-12-28 MED ORDER — ACETAMINOPHEN 325 MG PO TABS
ORAL_TABLET | ORAL | Status: AC
  Administered 2014-12-28: 650 mg via ORAL
  Filled 2014-12-28: qty 2

## 2014-12-28 MED ORDER — ACETAMINOPHEN 500 MG PO TABS
ORAL_TABLET | ORAL | Status: AC
  Filled 2014-12-28: qty 2

## 2014-12-28 MED ORDER — OXYCODONE-ACETAMINOPHEN 5-325 MG PO TABS
ORAL_TABLET | ORAL | Status: AC
  Administered 2014-12-28: 1 via ORAL
  Filled 2014-12-28: qty 1

## 2014-12-28 NOTE — ED Notes (Signed)
BEHAVIORAL HEALTH ROUNDING Patient sleeping: Yes.   Patient alert and oriented: not applicable Behavior appropriate: Yes.    Nutrition and fluids offered: No Toileting and hygiene offered: No Sitter present: q15 minute observations Law enforcement present: Yes [employer] 

## 2014-12-28 NOTE — ED Notes (Signed)
1:1 sitter at bedside.

## 2014-12-28 NOTE — ED Notes (Signed)
Pts visitor: wife at bedside. Visitation supervised by Bernette RedbirdKenny, CNA.

## 2014-12-28 NOTE — ED Notes (Signed)
Pt in room. No complaints or concerns voiced at this time. No abnormal behavior noted at this time. Will continue to monitor with q15 min checks. ODS officer in area. 
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
## 2014-12-28 NOTE — ED Provider Notes (Signed)
-----------------------------------------   6:57 AM on 12/28/2014 -----------------------------------------   Blood pressure 147/74, pulse 100, temperature 98.4 F (36.9 C), temperature source Oral, resp. rate 18, height 6\' 1"  (1.854 m), weight 231 lb 7.7 oz (105 kg), SpO2 100 %.  The patient had no acute events since last update.  Calm and cooperative at this time.  Disposition is pending per Psychiatry/Behavioral Medicine team recommendations.     Irean HongJade J Sung, MD 12/28/14 [phone]

## 2014-12-28 NOTE — BHH Counselor (Signed)
Call from [facility] requesting an EKG, chest x-ray and UA before patient can be accepted for admission.

## 2014-12-28 NOTE — ED Notes (Signed)
Ivc/inpatient admission when medically cleared per dr clapacs

## 2014-12-28 NOTE — ED Notes (Signed)
BEHAVIORAL HEALTH ROUNDING Patient sleeping: No. Patient alert and oriented: yes Behavior appropriate: Yes.  ;  Nutrition and fluids offered: Yes  Toileting and hygiene offered: Yes  Sitter present: yes Law enforcement present: Yes  

## 2014-12-28 NOTE — ED Provider Notes (Signed)
ED ECG REPORT I, Nicholas [name],  Abbie Jablon M, the attending physician, personally viewed and interpreted this ECG.   Date: 12/28/2014  EKG Time: 720  Rate: 96  Rhythm: normal EKG, normal sinus rhythm  Axis: Normal axis  Intervals:none  ST&T Change: No ST elevations or depressions. No abnormal T-wave inversions.  EKG done for placement purposes.   Myrna Blazeravid Matthew Zahari Fazzino, MD 12/28/14 [phone]

## 2014-12-28 NOTE — ED Notes (Signed)
BEHAVIORAL HEALTH ROUNDING Patient sleeping: No. Patient alert and oriented: yes Behavior appropriate: Yes.   Nutrition and fluids offered: Yes  Toileting and hygiene offered: Yes  Sitter present: q15 min observations Law enforcement present: Yes [employer] 

## 2014-12-28 NOTE — ED Notes (Signed)

## 2014-12-28 NOTE — BH Assessment (Signed)
09:37-Per the request of the [facility], additional information about his cancer was forwarded to them.  19:05-Writer followed with [facility] ([phone]) and referral is pending review by their medical staff, for the Wait List.

## 2014-12-28 NOTE — ED Notes (Signed)
Pt has received his [employer] cur[employer].

## 2014-12-28 NOTE — ED Notes (Signed)
Pt reporting c/o lower back pain. Pt received x2 650mg  Tylenol this AM. Pt reports no pain relief. MD notified.

## 2014-12-29 MED ORDER — CEPHALEXIN 500 MG PO CAPS
500.0000 mg | ORAL_CAPSULE | Freq: Three times a day (TID) | ORAL | Status: DC
  Administered 2014-12-29 – 2014-12-31 (×8): 500 mg via ORAL
  Filled 2014-12-29 (×7): qty 1

## 2014-12-29 MED ORDER — CEPHALEXIN 500 MG PO CAPS
ORAL_CAPSULE | ORAL | Status: AC
  Administered 2014-12-29: 500 mg via ORAL
  Filled 2014-12-29: qty 1

## 2014-12-29 NOTE — ED Notes (Signed)
BEHAVIORAL HEALTH ROUNDING Patient sleeping: Yes.   Patient alert and oriented: not applicable Behavior appropriate: Yes.  ; If no, describe:  Nutrition and fluids offered: No Toileting and hygiene offered: No Sitter present: Recruitment consultantsafety sitter present Law enforcement present: Yes

## 2014-12-29 NOTE — ED Notes (Signed)
BEHAVIORAL HEALTH ROUNDING Patient sleeping: Yes.   Patient alert and oriented: not applicable Behavior appropriate: Yes.    Nutrition and fluids offered: No Toileting and hygiene offered: No Sitter present: q15 minute observations Law enforcement present: Yes [employer]  ENVIRONMENTAL ASSESSMENT Potentially harmful objects out of patient reach: Yes.   Personal belongings secured: Yes.   Patient dressed in hospital provided attire only: Yes.   Plastic bags out of patient reach: Yes.   Patient care equipment (cords, cables, call bells, lines, and drains) shortened, removed, or accounted for: Yes.   Equipment and supplies removed from bottom of stretcher: Yes.   Potentially toxic materials out of patient reach: Yes.   Sharps container removed or out of patient reach: Yes.   

## 2014-12-29 NOTE — ED Notes (Signed)
BEHAVIORAL HEALTH ROUNDING Patient sleeping: No. Patient alert and oriented: calm and cooperative Behavior appropriate: Yes.  ; If no, describe:  Nutrition and fluids offered: Yes  Toileting and hygiene offered: Yes  Sitter present: Recruitment consultantsafety sitter at bedside Law enforcement present: Yes

## 2014-12-29 NOTE — ED Notes (Signed)
BEHAVIORAL HEALTH ROUNDING Patient sleeping: Yes.   Patient alert and oriented: not applicable Behavior appropriate: Yes.    Nutrition and fluids offered: No Toileting and hygiene offered: No Sitter present: q15 minute observations Law enforcement present: Yes [employer] 

## 2014-12-29 NOTE — ED Notes (Signed)
Pt in room. No complaints or concerns voiced at this time. No abnormal behavior noted at this time. Will continue to monitor with q15 min checks. ODS officer in area. 

## 2014-12-29 NOTE — ED Notes (Signed)
ENVIRONMENTAL ASSESSMENT  Potentially harmful objects out of patient reach: Yes.  Personal belongings secured: Yes.  Patient dressed in hospital provided attire only: Yes.  Plastic bags out of patient reach: Yes.  Patient care equipment (cords, cables, call bells, lines, and drains) shortened, removed, or accounted for: Yes.  Equipment and supplies removed from bottom of stretcher: Yes.  Potentially toxic materials out of patient reach: Yes.  Sharps container removed or out of patient reach: Yes.  ED BHU PLACEMENT JUSTIFICATION  Is the patient under IVC or is there intent for IVC: Yes.  Is the patient medically cleared: Yes.  Is there vacancy in the ED BHU: Yes.  Is the population mix appropriate for patient: Yes.  Is the patient awaiting placement in inpatient or outpatient setting: Yes.  Has the patient had a psychiatric consult: Yes.  Survey of unit performed for contraband, proper placement and condition of furniture, tampering with fixtures in bathroom, shower, and each patient room: Yes. ; Findings: All clear  APPEARANCE/BEHAVIOR  calm, cooperative and adequate rapport can be established  NEURO ASSESSMENT  Orientation:  person which is pt's baseline. Hx of dementia.  Hallucinations: No.None noted (Hallucinations)  Speech: Normal  Gait: unsteady, assistance provide with ambulation by staff.  RESPIRATORY ASSESSMENT  WNL  CARDIOVASCULAR ASSESSMENT  WNL  GASTROINTESTINAL ASSESSMENT  WNL  EXTREMITIES  WNL  PLAN OF CARE  Provide calm/safe environment. Vital signs assessed twice daily. ED BHU Assessment once each 12-hour shift. Collaborate with intake RN daily or as condition indicates. Assure the ED provider has rounded once each shift. Provide and encourage hygiene. Provide redirection as needed. Assess for escalating behavior; address immediately and inform ED provider.  Assess family dynamic and appropriateness for visitation as needed: Yes. ; If necessary, describe findings:   Educate the patient/family about BHU procedures/visitation: Yes. ; If necessary, describe findings: Pt is calm and cooperative at this time.  Will continue to monitor.

## 2014-12-29 NOTE — ED Provider Notes (Signed)
-----------------------------------------   7:05 AM on 12/29/2014 -----------------------------------------   Blood pressure 150/98, pulse 101, temperature 98.9 F (37.2 C), temperature source Oral, resp. rate 15, height 6\' 1"  (1.854 m), weight 231 lb 7.7 oz (105 kg), SpO2 92 %.  The patient had no acute events since last update.  Calm and cooperative at this time.  Disposition is pending per Psychiatry/Behavioral Medicine team recommendations.   It is noted the patient did have white blood cells in clumps noted on his urinalysis. I request a urine culture.   Sharyn CreamerMark Therese Rocco, MD 12/29/14 [phone]

## 2014-12-29 NOTE — ED Notes (Signed)
Discussed urine results with Dr Darnelle CatalanMalinda,   Orders received.

## 2014-12-29 NOTE — ED Notes (Signed)
BEHAVIORAL HEALTH ROUNDING Patient sleeping: Yes.   Patient alert and oriented: not applicable Behavior appropriate: No.; If no, describe:  Nutrition and fluids offered: Yes  Toileting and hygiene offered: No Sitter present: Recruitment consultantsafety sitter present Law enforcement present: Yes
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
## 2014-12-29 NOTE — ED Notes (Signed)
BEHAVIORAL HEALTH ROUNDING  Patient sleeping: No.  Patient alert and oriented: yes  Behavior appropriate: Yes. ; If no, describe:  Nutrition and fluids offered: Yes  Toileting and hygiene offered: Yes  Sitter present: not applicable  Law enforcement present: Yes [employer]  

## 2014-12-29 NOTE — ED Notes (Signed)
Report received from RN Kerry  Pt in room. No complaints or concerns voiced at this time. No abnormal behavior noted at this time. Will continue to monitor with q15 min checks. ODS officer in area. 

## 2014-12-29 NOTE — ED Notes (Addendum)

## 2014-12-29 NOTE — ED Notes (Signed)
BEHAVIORAL HEALTH ROUNDING Patient sleeping: No. Patient alert and oriented: pt alert, calm and cooperative Behavior appropriate: Yes.  ; If no, describe:  Nutrition and fluids offered: Yes  Toileting and hygiene offered: Yes  Sitter present: Recruitment consultantsafety sitter present,  Law enforcement present: Yes

## 2014-12-29 NOTE — ED Notes (Signed)
BEHAVIORAL HEALTH ROUNDING Patient sleeping: Yes.   Patient alert and oriented: not applicable Behavior appropriate: No.; If no, describe:  Nutrition and fluids offered: Yes  Toileting and hygiene offered: No Sitter present: safety sitter present Law enforcement present: Yes  

## 2014-12-30 LAB — URINALYSIS COMPLETE WITH MICROSCOPIC (ARMC ONLY)
BILIRUBIN URINE: NEGATIVE
Bacteria, UA: NONE SEEN
GLUCOSE, UA: NEGATIVE mg/dL
Nitrite: NEGATIVE
PH: 5 (ref 5.0–8.0)
Protein, ur: 30 mg/dL — AB
Specific Gravity, Urine: 1.02 (ref 1.005–1.030)

## 2014-12-30 NOTE — ED Notes (Signed)
Breakfast provided   Pt awakened and is sitting up in bed eating   Pt visualized with NAD  No verbalized needs or concerns at this time  Continue to monitor

## 2014-12-30 NOTE — ED Notes (Signed)
ED BHU PLACEMENT JUSTIFICATION Is the patient under IVC or is there intent for IVC: Yes.   Is the patient medically cleared: Yes.   Is there vacancy in the ED BHU: Yes.   Is the population mix appropriate for patient: Yes.   Is the patient awaiting placement in inpatient or outpatient setting: Yes.   Has the patient had a psychiatric consult: Yes.   Survey of unit performed for contraband, proper placement and condition of furniture, tampering with fixtures in bathroom, shower, and each patient room: Yes.  ; Findings:  APPEARANCE/BEHAVIOR Calm and cooperative NEURO ASSESSMENT Orientation: oriented x 1 Denies pain Hallucinations: No.None noted (Hallucinations) Speech: Normal Gait: unsteady RESPIRATORY ASSESSMENT Even  Unlabored respirations  CARDIOVASCULAR ASSESSMENT Pulses equal   regular rate  Skin warm and dry   GASTROINTESTINAL ASSESSMENT no GI complaint EXTREMITIES Full ROM  PLAN OF CARE Provide calm/safe environment. Vital signs assessed twice daily. ED BHU Assessment once each 12-hour shift. Collaborate with intake RN daily or as condition indicates. Assure the ED provider has rounded once each shift. Provide and encourage hygiene. Provide redirection as needed. Assess for escalating behavior; address immediately and inform ED provider.  Assess family dynamic and appropriateness for visitation as needed: Yes.  ; If necessary, describe findings:  Educate the patient/family about BHU procedures/visitation: Yes.  ; If necessary, describe findings:
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
## 2014-12-30 NOTE — ED Notes (Signed)
BEHAVIORAL HEALTH ROUNDING Patient sleeping: Yes.   Patient alert and oriented: not applicable SLEEPING Behavior appropriate: Yes.  ; If no, describe: SLEEPING Nutrition and fluids offered: No SLEEPING Toileting and hygiene offered: NoSLEEPING Sitter present: not applicable Law enforcement present: Yes [employer] 

## 2014-12-30 NOTE — ED Notes (Signed)
BEHAVIORAL HEALTH ROUNDING Patient sleeping: No. Patient alert and oriented: no Behavior appropriate: Yes.  ; If no, describe:  Nutrition and fluids offered: yes Toileting and hygiene offered: Yes  Sitter present: q15 minute observations and security camera monitoring Law enforcement present: Yes  [employer]  

## 2014-12-30 NOTE — ED Notes (Signed)
Patient observed lying in bed with eyes closed  Even, unlabored respirations observed   NAD pt appears to be sleeping  I will continue to monitor along with every 15 minute visual observations and ongoing security camera monitoring    

## 2014-12-30 NOTE — BHH Counselor (Signed)
[facility] of [facility]'s [facility]inalysis [facility].

## 2014-12-30 NOTE — ED Notes (Signed)
BEHAVIORAL HEALTH ROUNDING Patient sleeping: No. Patient alert and oriented:  No  Behavior appropriate: Yes.  ; If no, describe:  Nutrition and fluids offered: yes Toileting and hygiene offered: Yes  Sitter present: q15 minute observations and security camera monitoring Law enforcement present: Yes  [employer]

## 2014-12-30 NOTE — ED Notes (Signed)
I have attempted to call the [phone] number back several times since she hung up on me - no one will answer the line and it no longer rings to a voicemail

## 2014-12-30 NOTE — ED Notes (Signed)
BEHAVIORAL HEALTH ROUNDING Patient sleeping: Yes.   Patient alert and oriented: eyes closed  Appears asleep Behavior appropriate: Yes.  ; If no, describe:  Nutrition and fluids offered: Yes  Toileting and hygiene offered: sleeping Sitter present: q 15 minute observations and security camera monitoring Law enforcement present: yes  [employer] 

## 2014-12-30 NOTE — ED Notes (Signed)
BEHAVIORAL HEALTH ROUNDING Patient sleeping: No. Patient alert and oriented: no Behavior appropriate: Yes.  ; If no, describe:  Nutrition and fluids offered: yes Toileting and hygiene offered: Yes  Sitter present: q15 minute observations and security camera monitoring Law enforcement present: Yes  [employer]

## 2014-12-30 NOTE — ED Notes (Addendum)
BEHAVIORAL HEALTH ROUNDING Patient sleeping: Yes.   Patient alert and oriented: not applicable Behavior appropriate: Yes.    Nutrition and fluids offered: No Toileting and hygiene offered: No Sitter present: q15 minute observations Law enforcement present: Yes [employer]  ENVIRONMENTAL ASSESSMENT Potentially harmful objects out of patient reach: Yes.   Personal belongings secured: Yes.   Patient dressed in hospital provided attire only: Yes.   Plastic bags out of patient reach: Yes.   Patient care equipment (cords, cables, call bells, lines, and drains) shortened, removed, or accounted for: Yes.   Equipment and supplies removed from bottom of stretcher: Yes.   Potentially toxic materials out of patient reach: Yes.   Sharps container removed or out of patient reach: Yes.    ED BHU PLACEMENT JUSTIFICATION Is the patient under IVC or is there intent for IVC: Yes.    Is IVC current? Yes Is the patient medically cleared: Yes.   Is there vacancy in the ED BHU: Yes.   Is the population mix appropriate for patient: No pt needs assistance with walking and ADLs.   Is the patient awaiting placement in inpatient or outpatient setting: Yes.   Has the patient had a psychiatric consult: Yes.   Survey of unit performed for contraband, proper placement and condition of furniture, tampering with fixtures in bathroom, shower, and each patient room: Yes.  ; Findings: none APPEARANCE/BEHAVIOR Cooperative,calm NEURO ASSESSMENT Orientation: person Hallucinations: none noted at this time Speech: Normal Gait: shuffling RESPIRATORY ASSESSMENT Breathing Pattern-regular, no respiratory distress noted CARDIOVASCULAR ASSESSMENT Skin color appropriate for age and race GASTROINTESTINAL ASSESSMENT no GI distress noted EXTREMITIES Moves all extremities, no distress noted PLAN OF CARE Provide calm/safe environment. Vital signs assessed twice daily. ED BHU Assessment once each 12-hour shift. Collaborate  with intake RN daily or as condition indicates. Assure the ED provider has rounded once each shift. Provide and encourage hygiene. Provide redirection as needed. Assess for escalating behavior; address immediately and inform ED provider.  Assess family dynamic and appropriateness for visitation as needed: Yes.  Educate the patient/family about BHU procedures/visitation: Yes.

## 2014-12-30 NOTE — ED Provider Notes (Signed)
-----------------------------------------   9:24 AM on 12/30/2014 -----------------------------------------   Blood pressure 133/104, pulse 80, temperature 98.6 F (37 C), temperature source Oral, resp. rate 18, height 6\' 1"  (1.854 m), weight 231 lb 7.7 oz (105 kg), SpO2 96 %.  The patient had no acute events since last update.  Calm and cooperative at this time.  Patient is being treated with Keflex for a urinary tract infection. remains medically stable. Disposition is pending per Psychiatry/Behavioral Medicine team recommendations.     Sharman CheekPhillip Thiago Ragsdale, MD 12/30/14 [phone]

## 2014-12-30 NOTE — ED Notes (Signed)
Pt in room. No complaints or concerns voiced at this time. No abnormal behavior noted at this time. Will continue to monitor with q15 min checks. ODS officer in area. 

## 2014-12-30 NOTE — ED Notes (Signed)
I have not received any call back from CSW - (is anyone working today)  I called again and left another message - pleading for someone to call me back so that I can reassure this spouse

## 2014-12-30 NOTE — Progress Notes (Addendum)
ED visit made. Patient remains in the ED with a 1 to 1 sitter. Patient seen lying on the ED stretcher alert, complained of itching to his groin, reported to Amy RN. He is currently taking keflex orally for treatment of a UTI. He has been taking his oral medications regularly per chart review. Writer spoke with attending physician Dr. Scotty CourtStafford, no acute issues. Per writer's conversation with CSW Frederico Hammanara Collier patient continues to await Geriatric inpatient psych placement, not locked dementia unit. Hospice team updated. Thank you. Dayna BarkerKaren Robertson RN, BSN, [facility] [place], [facility] [phone] c

## 2014-12-30 NOTE — ED Notes (Signed)
I called [phone] again and this time someone named Delice Bisonara answered the phone  - i am attempting to orient her with this pt and his spouse when she slams the phone down and hangs up on me  - this pt's spouse heard and observed the entire thing   i will have to complete a safety zone portal due to the unprofessional behavior that was exhibited  - team players do not hang up on others - I have not seen a CSW in the ED in weeks - something must be done   I continued to soothe the spouse because at this point it is me and her and the ones getting paid to find placement get away with hanging up on me and the pt's family member

## 2014-12-30 NOTE — ED Notes (Signed)
He has attempted to call his wife - noone answered the phone  - he was able to dial and recall the number on his own

## 2014-12-30 NOTE — Consult Note (Signed)
  Psychiatry: Follow-up for this [age] year old man with dementia with behavioral disturbance. Patient has no new complaints. His urinary tract infection has been treated and he has been compliant with Seroquel. He has not shown any new dangerous or agitated behavior.  Chart reviewed. Unfortunately it appears that he has been turned down for some reason by every geriatric psychiatry facility we have contacted. [facility]. Patient continues to have a history of behavior that makes him unsafe to go back home to stay with his family.  Diagnosis Alzheimer's dementia with behavior disturbance. Continue current medicine. Vital signs stable

## 2014-12-30 NOTE — ED Notes (Addendum)
I have been sitting with the spouse and trying to help her realize that it will not be safe for her to take him home with her  Clapacs last note states that he refers the pt to be placed on an alzheimer's unit (locked unit)  Educated his spouse on the difference between mental illness and alzheimers/dementia and educated her about the difference between an alzheimer's unit and a geriatric behavioral unit

## 2014-12-30 NOTE — ED Notes (Signed)
I just received word from my charge nurse that the CSW Tara from thDelice Bisone [phone] number has called the charge nurse and told her to tell me not to call her back again   MEANWHILE THE PT AND HIS SPOUSE NEED HELP

## 2014-12-30 NOTE — ED Notes (Signed)
Supper provided along with an extra drink  Pt observed lying in bed    Pt visualized with NAD  No verbalized needs or concerns at this time  Continue to monitor

## 2014-12-30 NOTE — ED Notes (Signed)
BEHAVIORAL HEALTH ROUNDING Patient sleeping: No. Patient alert and oriented: yes Behavior appropriate: Yes.   Nutrition and fluids offered: Yes  Toileting and hygiene offered: Yes  Sitter present: q15 min observations Law enforcement present: Yes [employer] 
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
## 2014-12-30 NOTE — ED Notes (Signed)
Am meds administered as ordered  Assessment completed  He denies pain  

## 2014-12-30 NOTE — ED Notes (Addendum)
BEHAVIORAL HEALTH ROUNDING Patient sleeping: No. Patient alert and oriented: no Behavior appropriate: Yes.  ; If no, describe:  Nutrition and fluids offered: yes Toileting and hygiene offered: Yes  Sitter present: q15 minute observations and security camera monitoring Law enforcement present: Yes  ODS  

## 2014-12-30 NOTE — ED Notes (Addendum)
I spoke with his spouse again and she reports that she is going to go home and try to get some rest  "i have not slept in three years because I have to stay awake to keep him safe."  Spouse reassured of his safety here - Clapacs continues to refer him for placement in an alzheimer's unit (locked unit)

## 2014-12-31 LAB — URINE CULTURE
Culture: NO GROWTH
SPECIAL REQUESTS: NORMAL

## 2014-12-31 MED ORDER — DIPHENHYDRAMINE HCL 25 MG PO CAPS
ORAL_CAPSULE | ORAL | Status: AC
  Administered 2014-12-31: 25 mg
  Filled 2014-12-31: qty 1

## 2014-12-31 MED ORDER — CEPHALEXIN 500 MG PO CAPS
500.0000 mg | ORAL_CAPSULE | Freq: Four times a day (QID) | ORAL | Status: DC
  Administered 2014-12-31 – 2015-01-02 (×10): 500 mg via ORAL
  Filled 2014-12-31 (×8): qty 1

## 2014-12-31 MED ORDER — CEPHALEXIN 500 MG PO CAPS: 500.0000 mg | ORAL_CAPSULE | Freq: Two times a day (BID) | ORAL | Status: DC

## 2014-12-31 NOTE — NC FL2 (Signed)
MEDICAID FL2 LEVEL OF CARE SCREENING TOOL     IDENTIFICATION  Patient Name: Nicholas [name] Birthdate: 07/11/1931 Sex: male Admission Date (Current Location): 12/26/2014  [place]   Facility and Address:  [facility], [address], [place], [place] [address]      Provider Number: [id number]  Attending Physician Name and Address:  No att. providers found  Relative Name and Phone Number:       Current Level of Care: Hospital Recommended Level of Care: Memory Care Prior Approval Number:    Date Approved/Denied:   PASRR Number:    Discharge Plan: Domiciliary (Rest home)    Current Diagnoses: Patient Active Problem List   Diagnosis Date Noted  . Alzheimer's dementia with behavioral disturbance 12/26/2014  . SVT (supraventricular tachycardia) (HCC) 11/26/2014        Bladder Cancer followed by Hospice  Orientation ACTIVITIES/SOCIAL BLADDER RESPIRATION    Self, Place  Family supportive Incontinent Normal  BEHAVIORAL SYMPTOMS/MOOD NEUROLOGICAL BOWEL NUTRITION STATUS  Verbally abusive   Continent    PHYSICIAN VISITS COMMUNICATION OF NEEDS Height & Weight Skin  30 days Verbally   231 lbs. Normal          AMBULATORY STATUS RESPIRATION    Supervision limited Normal      Personal Care Assistance Level of Assistance  Bathing, Dressing Bathing Assistance: Limited assistance   Dressing Assistance: Limited assistance      Functional Limitations Info                SPECIAL CARE FACTORS FREQUENCY                      Additional Factors Info     Hospice to evaluate and treat at ALF               Current Medications (12/31/2014): Current Facility-Administered Medications  Medication Dose Route Frequency Provider Last Rate Last Dose  . cephALEXin (KEFLEX) capsule 500 mg  500 mg Oral 4 times per day Darci Currentandolph N Brown, MD   500 mg at 12/31/14 1718  . metoprolol tartrate  (LOPRESSOR) tablet 25 mg  25 mg Oral BID Audery AmelJohn T Clapacs, MD   25 mg at 12/31/14 1130  . QUEtiapine (SEROQUEL) tablet 100 mg  100 mg Oral QHS Audery AmelJohn T Clapacs, MD   100 mg at 12/30/14 2210   Current Outpatient Prescriptions  Medication Sig Dispense Refill  . cetirizine (ZYRTEC) 10 MG tablet Take 10 mg by mouth daily as needed for allergies.     . diphenhydrAMINE (BENADRYL) 25 MG tablet Take 25 mg by mouth at bedtime as needed for itching or sleep.    . metoprolol tartrate (LOPRESSOR) 25 MG tablet Take 1 tablet (25 mg total) by mouth 2 (two) times daily. 60 tablet 0  . naproxen sodium (ANAPROX) 220 MG tablet Take 220-440 mg by mouth 2 (two) times daily as needed (for pain).     . QUEtiapine (SEROQUEL) 50 MG tablet Take 50 mg by mouth 2 (two) times daily.     Do not use this list as official medication orders. Please verify with discharge summary.  Discharge Medications:   Medication List    ASK your doctor about these medications        cetirizine 10 MG tablet  Commonly known as:  ZYRTEC  Take 10 mg by mouth daily as needed for allergies.     diphenhydrAMINE 25 MG tablet  Commonly  known as:  BENADRYL  Take 25 mg by mouth at bedtime as needed for itching or sleep.     metoprolol tartrate 25 MG tablet  Commonly known as:  LOPRESSOR  Take 1 tablet (25 mg total) by mouth 2 (two) times daily.     naproxen sodium 220 MG tablet  Commonly known as:  ANAPROX  Take 220-440 mg by mouth 2 (two) times daily as needed (for pain).     QUEtiapine 50 MG tablet  Commonly known as:  SEROQUEL  Take 50 mg by mouth 2 (two) times daily.        Relevant Imaging Results:  Relevant Lab Results:  Recent Labs    Additional Information    Ned Card, LCSW
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
## 2014-12-31 NOTE — ED Notes (Signed)
BEHAVIORAL HEALTH ROUNDING Patient sleeping: Yes.   Patient alert and oriented: not applicable Behavior appropriate: Yes.    Nutrition and fluids offered: No Toileting and hygiene offered: No Sitter present: q15 minute observations Law enforcement present: Yes [employer] 

## 2014-12-31 NOTE — ED Notes (Addendum)
BEHAVIORAL HEALTH ROUNDING Patient sleeping: no Patient alert and oriented: YES Behavior appropriate: YES Describe behavior: No inappropriate or unacceptable behaviors noted at this time.  Nutrition and fluids offered: YES Toileting and hygiene offered: YES Sitter present: Interior and spatial designerBehavioral tech rounding every 15 minutes on patient to ensure safety. Sitter at bedside.  Law enforcement present: Loss adjuster, charteredYES Law enforcement agency: [employer] (ODS)

## 2014-12-31 NOTE — ED Notes (Signed)
Pt in room. No complaints or concerns voiced at this time. No abnormal behavior noted at this time. Will continue to monitor with q15 min checks. ODS officer in area. 

## 2014-12-31 NOTE — ED Notes (Addendum)
BEHAVIORAL HEALTH ROUNDING Patient sleeping: YES Patient alert and oriented: YES Behavior appropriate: YES Describe behavior: No inappropriate or unacceptable behaviors noted at this time.  Nutrition and fluids offered: YES Toileting and hygiene offered: YES Sitter present: Behavioral tech rounding every 15 minutes on patient to ensure safety. Sitter at bedside.  Law enforcement present: YES Law enforcement agency: [employer] (ODS) 

## 2014-12-31 NOTE — ED Notes (Signed)
Breakfast tray given to sitter 

## 2014-12-31 NOTE — ED Notes (Addendum)
BEHAVIORAL HEALTH ROUNDING Patient sleeping: NO Patient alert and oriented: YES Behavior appropriate: YES Describe behavior: No inappropriate or unacceptable behaviors noted at this time.  Nutrition and fluids offered: YES Toileting and hygiene offered: YES Sitter present: Interior and spatial designerBehavioral tech rounding every 15 minutes on patient to ensure safety. Sitter at bedside.  Law enforcement present: Loss adjuster, charteredYES Law enforcement agency: [employer] (ODS)

## 2014-12-31 NOTE — Progress Notes (Signed)
CSW spoke with Pt's wife regarding update with Pt's discharge plans. Pt's wife spoke to [facility] about having a bed but did not understand that Pt will be medicaid and they do not have a medicaid bed. CSW [facility] and they are considering Pt given no new episodes. Pt's wife [place] [facility] [place] to difficulty [place]. CSW will continue to update family as dc plan progresses.   Wilford Gristara Lucile Hillmann, LCSW [phone]

## 2014-12-31 NOTE — ED Notes (Addendum)
BEHAVIORAL HEALTH ROUNDING Patient sleeping: NO Patient alert and oriented: YES Behavior appropriate: YES Describe behavior: No inappropriate or unacceptable behaviors noted at this time.  Nutrition and fluids offered: YES Toileting and hygiene offered: YES Sitter present: Behavioral tech rounding every 15 minutes on patient to ensure safety. Sitter at bedside.  Law enforcement present: YES Law enforcement agency: [employer] (ODS) 

## 2014-12-31 NOTE — Consult Note (Signed)
  Psychiatry: Follow-up for this 81 year old man with dementia. Difficult situation. Apparently he has been declined at geriatric psychiatry units. The patient himself remains quiet reserved and hardly ever gets out of bed. When I spoke to him he spoke a few words. Did not look depressed. He is eating. Has not been hostile.  Patient really needs to be in a locked memory unit would be the best disposition. Current medication seems to be helping a lot in keeping him from getting agitated. No change to diagnosis. Case discussed with the social work team today.

## 2014-12-31 NOTE — ED Provider Notes (Signed)
-----------------------------------------   7:35 AM on 12/31/2014 -----------------------------------------   Blood pressure 131/92, pulse 106, temperature 98.4 F (36.9 C), temperature source Oral, resp. rate 18, height 6\' 1"  (1.854 m), weight 231 lb 7.7 oz (105 kg), SpO2 94 %.  The patient had no acute events since last update.  Calm and cooperative at this time.  Disposition is pending per Psychiatry/Behavioral Medicine team recommendations.   Pt with UTI, reconfirmed.  He has been on keflex and this was rewritten as standing order, however, micro is still pending. He has 2 urine cultures with no results as of yet. Afebrile. The first shows no evidence of growth per micro lab.  The second micro is still pending.   Jeanmarie PlantJames A Kadijah Shamoon, MD 12/31/14 [phone]

## 2014-12-31 NOTE — ED Notes (Signed)
BEHAVIORAL HEALTH ROUNDING Patient sleeping: No. Patient alert and oriented: yes Behavior appropriate: Yes.   Nutrition and fluids offered: Yes  Toileting and hygiene offered: Yes  Sitter present: q15 min observations Law enforcement present: Yes [employer] 

## 2014-12-31 NOTE — Progress Notes (Signed)
ED visit made. Patient seen lying on the ED stretcher, eyes closed, easily awakened, remained calm. Able to answer simple questions, reports his wife had been in earlier and was now out running errands. He continues to report and complain of itching, reported to staff RN Collyn who agreed to follow up. Per discussion with CSW Delice Bisonara patient continues to await placement, unclear if this will be Geri-psych or locked dementia unit as patient has remained calm with out the need for any PRN medications.  He remains on keflex po Qid and Seroquel 100 mg at bedtime, he has been able to take his oral medications, appetite fair per staff aide report. Per writer's conversation with Delice Bisonara Mrs. Bess KindsKimrey has obtained the paper work from DSS to apply for Medicaid. Update to hospice team. Will continue to follow through final disposition. Dayna BarkerKaren Robertson RN, BSN, [facility] [place], [facility] [phone] c

## 2015-01-01 LAB — URINE CULTURE: SPECIAL REQUESTS: NORMAL

## 2015-01-01 LAB — GLUCOSE, CAPILLARY: Glucose-Capillary: 121 mg/dL — ABNORMAL HIGH (ref 65–99)

## 2015-01-01 NOTE — Progress Notes (Signed)
ED visit made. Mr. Nicholas [name] seen lying on the ED stretcher, asleep. Per staff RN [name] patient was assisted with his breakfast this morning, he has not required any PRN medications for behavior. He has remained calm, taking oral medications. Per discussion with CSW Delice Bisonara and chart note review, patient is now awaiting placement in a locked dementia unit. Family not present at time of visit. Will continue to follow and update hospice team. Dayna BarkerKaren Robertson, RN, BSN, [facility] [place], [facility] [phone] c

## 2015-01-01 NOTE — ED Notes (Signed)
Pt assisted to urinate in urinal.  Approx 20cc of urine output.

## 2015-01-01 NOTE — Consult Note (Signed)
  Psychiatry: Follow-up for 81 year old man with history of dementia. Patient has shown no new behavior problems. He is calm. Vital signs stable. Tolerating medicine.  Case discussed with social work. We are trying to get appropriate placement for him in a memory unit as early as possible. Hopefully disposition will come soon.

## 2015-01-01 NOTE — ED Notes (Signed)
Wife to visit patient; wife requesting that pt sign legal papers for their bank. Determined that pt not at appropriate mentality to sign papers. Wife states that she understands. Wife left.

## 2015-01-01 NOTE — ED Notes (Signed)
Report from Rebecca, RN

## 2015-01-01 NOTE — ED Provider Notes (Signed)
-----------------------------------------   6:22 AM on 01/01/2015 -----------------------------------------   Blood pressure 101/64, pulse 148, temperature 97.5 F (36.4 C), temperature source Oral, resp. rate 16, height 6\' 1"  (1.854 m), weight 231 lb 7.7 oz (105 kg), SpO2 94 %.  The patient had no acute events since last update.  Calm and cooperative at this time.  Disposition is pending per Psychiatry/Behavioral Medicine team recommendations.     Irean HongJade J Asaf Elmquist, MD 01/01/15 [phone]

## 2015-01-01 NOTE — ED Notes (Signed)
Pt changed, turned, pillow under right hip. Peri care performed. Pt pulled up in the bed. Denies further needs.

## 2015-01-02 MED ORDER — CEPHALEXIN 500 MG PO CAPS
ORAL_CAPSULE | ORAL | Status: AC
  Administered 2015-01-02: 500 mg via ORAL
  Filled 2015-01-02: qty 1

## 2015-01-02 MED ORDER — TUBERCULIN PPD 5 UNIT/0.1ML ID SOLN
5.0000 [IU] | Freq: Once | INTRADERMAL | Status: DC
  Administered 2015-01-02: 5 [IU] via INTRADERMAL
  Filled 2015-01-02: qty 0.1

## 2015-01-02 NOTE — Progress Notes (Signed)
Late entry 01/02/15   CSW followed up with Pt today for dc coordination. Pt remains stable for dc to LTC facility. PT evaluation ordered for today revealed Pt is max assist now and too high level for ALF or memory care unit due to physical decline. CSW spoke with Pt's wife regarding change in disposition to SNF from ED. CSW discussed options in network with Pt's insurance and Pt's wife shared that she lives only a few miles [facility]. CSW contacted [facility] for potential placement. [facility] come [facility] Pt for review.   Wilford Gristara Yecheskel Kurek, LCSW [phone]

## 2015-01-02 NOTE — ED Notes (Signed)
Pt given bath, clean scrubs and placed in recliner. Pt noted to be a max 2 person assist at this time. PT eval of patient, PT states "took 4 steps then c/o back pain and sat back down". Pt calm and cooperative with staff at this time.

## 2015-01-02 NOTE — Progress Notes (Signed)
Notified by CSW Toma Copier that patient was being discharged to [facility] for rehab. Writer met with patient's wife Nicholas [name] in the ED lobby, she voiced her relief that her husband would be able to leave the ED. She also voiced her understanding that Nicholas [name] would revoke his Hospice Medicare benefit in order to pursue rehab. Revocation paper work Visual merchandiser. Plan is for patient to be discharged today. Hospice team updated. Flo Shanks RN, BSN, [place] [facility] [place], [facility] [phone] c

## 2015-01-02 NOTE — BH Assessment (Signed)
Most recent [facility] ([phone]) and confirmed it was received.

## 2015-01-02 NOTE — ED Notes (Signed)
Report called to Faith, [facility] at [facility]. Pt to go Rm 167B on [address].

## 2015-01-02 NOTE — ED Notes (Signed)
Pt use urinal and perineal area cleaned, bedding changed

## 2015-01-02 NOTE — ED Notes (Signed)
NAD noted at this time. Pt resting in chair. Denies needs at this time. Will continue with 15 min safety checks at this time.

## 2015-01-02 NOTE — Evaluation (Signed)
Physical Therapy Evaluation Patient Details Name: Nicholas [name] MRN: [id number] DOB: 15-Aug-1931 Today's Date: 01/02/2015   History of Present Illness  Patient is an 81 y/o male that presents in the ED after a behavioral disturbance at home. Patient is noted to have Alzheimer's dementia.  Clinical Impression  This Thereasa Parkin evaluated this patient on 11/26/2014 and noted the patient ambulated without assistive device for 100' with supervision level of assistance. During this evaluation patient requires +2 assistance to transfer to standing and begins complaining of lower back pain during ambulation. PT is unable to determine if patient has had this lower back pain previously or if a fall occurred given the patient's cognitive status. Today's evaluation demonstrates a significant decrease in his ambulatory status and independence with mobility and therefore would benefit from short term rehabilitation to increase his mobility. Skilled PT services are indicated to address the above deficits.     Follow Up Recommendations SNF    Equipment Recommendations       Recommendations for Other Services       Precautions / Restrictions Precautions Precautions: Fall Restrictions Weight Bearing Restrictions: No      Mobility  Bed Mobility               General bed mobility comments: Patient received in chair   Transfers Overall transfer level: Needs assistance Equipment used: Rolling walker (2 wheeled) Transfers: Sit to/from Stand Sit to Stand: Mod assist;Max assist;+2 physical assistance         General transfer comment: Patient displays poor technique and planning and does not respond well to cuing to bring his trunk forward.   Ambulation/Gait Ambulation/Gait assistance: Min guard;Min assist Ambulation Distance (Feet): 8 Feet Assistive device: Rolling walker (2 wheeled) Gait Pattern/deviations: Trunk flexed;Decreased step length - right;Decreased step length - left   Gait  velocity interpretation: <1.8 ft/sec, indicative of risk for recurrent falls General Gait Details: Patient ambulates with flexed knees and very short steps. Patient states his lower back begins to hurt with ambulation and requests to sit.   Stairs            Wheelchair Mobility    Modified Rankin (Stroke Patients Only)       Balance Overall balance assessment: Needs assistance Sitting-balance support: Feet supported;Bilateral upper extremity supported Sitting balance-Leahy Scale: Fair Sitting balance - Comments: Patient seems to lean on his R side during sitting.  Postural control: Right lateral lean Standing balance support: Bilateral upper extremity supported Standing balance-Leahy Scale: Poor Standing balance comment: Patient ambulates with RW and flexed knees in gait, demonstrating high falls risk.                              Pertinent Vitals/Pain Pain Assessment:  (Patient complains of lumbar back pain during ambulation and at rest. He is unable to describe if the pain changes during ambulation or at rest. )    Home Living Family/patient expects to be discharged to:: Skilled nursing facility                      Prior Function Level of Independence: Independent         Comments: This author evaluated patient on 10/4, patient was supervision level of assist for mobility.      Hand Dominance        Extremity/Trunk Assessment   Upper Extremity Assessment: Overall WFL for tasks assessed  Lower Extremity Assessment: Overall WFL for tasks assessed         Communication   Communication: No difficulties (Pt with dementia and unable to provide any clear descriptions)  Cognition Arousal/Alertness: Awake/alert Behavior During Therapy: Flat affect;WFL for tasks assessed/performed Overall Cognitive Status: History of cognitive impairments - at baseline                      General Comments      Exercises         Assessment/Plan    PT Assessment Patient needs continued PT services  PT Diagnosis Difficulty walking;Generalized weakness;Acute pain;Abnormality of gait   PT Problem List Decreased strength;Decreased mobility;Decreased safety awareness;Decreased knowledge of precautions;Decreased cognition;Decreased activity tolerance;Decreased balance  PT Treatment Interventions DME instruction;Therapeutic activities;Therapeutic exercise;Gait training;Balance training;Neuromuscular re-education   PT Goals (Current goals can be found in the Care Plan section) Acute Rehab PT Goals PT Goal Formulation: Patient unable to participate in goal setting Time For Goal Achievement: 01/16/15 Potential to Achieve Goals: Fair    Frequency Min 2X/week   Barriers to discharge Inaccessible home environment;Decreased caregiver support No clear indication of living environment.     Co-evaluation               End of Session Equipment Utilized During Treatment: Gait belt Activity Tolerance: Patient limited by pain Patient left: in chair;with nursing/sitter in room      Functional Assessment Tool Used: Clinical judgement Functional Limitation: Mobility: Walking and moving around Mobility: Walking and Moving Around Current Status ([id number](G8978): At least 60 percent but less than 80 percent impaired, limited or restricted Mobility: Walking and Moving Around Goal Status [phone](G8979): At least 40 percent but less than 60 percent impaired, limited or restricted    Time: 0840-0901 PT Time Calculation (min) (ACUTE ONLY): 21 min   Charges:   PT Evaluation $Initial PT Evaluation Tier I: 1 Procedure     PT G Codes:   PT G-Codes **NOT FOR INPATIENT CLASS** Functional Assessment Tool Used: Clinical judgement Functional Limitation: Mobility: Walking and moving around Mobility: Walking and Moving Around Current Status ([id number](G8978): At least 60 percent but less than 80 percent impaired, limited or restricted Mobility: Walking  and Moving Around Goal Status [phone](G8979): At least 40 percent but less than 60 percent impaired, limited or restricted    Kerin RansomPatrick A McNamara, PT, DPT    01/02/2015, 1:22 PM

## 2015-01-02 NOTE — ED Provider Notes (Signed)
-----------------------------------------   6:56 AM on 01/02/2015 -----------------------------------------   Blood pressure 154/92, pulse 84, temperature 98.9 F (37.2 C), temperature source Oral, resp. rate 18, height 6\' 1"  (1.854 m), weight 231 lb 7.7 oz (105 kg), SpO2 94 %.  The patient had no acute events since last update.  Calm and cooperative at this time.  Disposition is pending per Psychiatry/Behavioral Medicine team recommendations.     Irean HongJade J Maliek Schellhorn, MD 01/02/15 [phone]

## 2015-01-02 NOTE — Discharge Instructions (Signed)

## 2015-01-02 NOTE — ED Notes (Signed)
NAD noted at this time. Pt resting in chair with his eyes closed at this time. Pt denies needs at this time. Will continue 15 min safety rounds.

## 2015-01-02 NOTE — NC FL2 (Signed)
[place] MEDICAID FL2 LEVEL OF CARE SCREENING TOOL     IDENTIFICATION  Patient Name: Nicholas HeidelbergFrederick B Fennel Birthdate: 1931-04-22 Sex: male Admission Date (Current Location): 12/26/2014  [place]   Facility and Address:  [facility], [address], [place], [place] [address]      Provider Number: [id number]  Attending Physician Name and Address:  No att. providers found  Relative Name and Phone Number:       Current Level of Care: Hospital Recommended Level of Care: Skilled Nursing Facility Prior Approval Number:    Date Approved/Denied:   PASRR Number:    Discharge Plan: SNF    Current Diagnoses: Patient Active Problem List   Diagnosis Date Noted  . Alzheimer's dementia with behavioral disturbance 12/26/2014  . SVT (supraventricular tachycardia) (HCC) 11/26/2014    Orientation ACTIVITIES/SOCIAL BLADDER RESPIRATION    Self, Place  Family supportive Incontinent Normal  BEHAVIORAL SYMPTOMS/MOOD NEUROLOGICAL BOWEL NUTRITION STATUS  Verbally abusive   Continent    PHYSICIAN VISITS COMMUNICATION OF NEEDS Height & Weight Skin  30 days Verbally   231 lbs. Normal          AMBULATORY STATUS RESPIRATION    Assist extensive Normal      Personal Care Assistance Level of Assistance  Bathing, Dressing, Feeding Bathing Assistance: Maximum assistance Feeding assistance: Limited assistance Dressing Assistance: Maximum assistance      Functional Limitations Info  Hearing   Hearing Info: Impaired         SPECIAL CARE FACTORS FREQUENCY  PT (By licensed PT), OT (By licensed OT)     PT Frequency: 5 OT Frequency: 3           Additional Factors Info  Allergies   Allergies Info: Antivert, Sulfa Antibiotics           Current Medications (01/02/2015): Current Facility-Administered Medications  Medication Dose Route Frequency Provider Last Rate Last Dose  . cephALEXin (KEFLEX) capsule 500 mg  500  mg Oral 4 times per day Darci Currentandolph N Brown, MD   500 mg at 01/02/15 0617  . metoprolol tartrate (LOPRESSOR) tablet 25 mg  25 mg Oral BID Audery AmelJohn T Clapacs, MD   25 mg at 01/01/15 2249  . QUEtiapine (SEROQUEL) tablet 100 mg  100 mg Oral QHS Audery AmelJohn T Clapacs, MD   100 mg at 01/01/15 2249  . tuberculin injection 5 Units  5 Units Intradermal Once Gayla DossEryka A Gayle, MD       Current Outpatient Prescriptions  Medication Sig Dispense Refill  . cetirizine (ZYRTEC) 10 MG tablet Take 10 mg by mouth daily as needed for allergies.     . diphenhydrAMINE (BENADRYL) 25 MG tablet Take 25 mg by mouth at bedtime as needed for itching or sleep.    . metoprolol tartrate (LOPRESSOR) 25 MG tablet Take 1 tablet (25 mg total) by mouth 2 (two) times daily. 60 tablet 0  . naproxen sodium (ANAPROX) 220 MG tablet Take 220-440 mg by mouth 2 (two) times daily as needed (for pain).     . QUEtiapine (SEROQUEL) 50 MG tablet Take 50 mg by mouth 2 (two) times daily.     Do not use this list as official medication orders. Please verify with discharge summary.  Discharge Medications:   Medication List    ASK your doctor about these medications        cetirizine 10 MG tablet  Commonly known as:  ZYRTEC  Take 10 mg by mouth daily as  needed for allergies.     diphenhydrAMINE 25 MG tablet  Commonly known as:  BENADRYL  Take 25 mg by mouth at bedtime as needed for itching or sleep.     metoprolol tartrate 25 MG tablet  Commonly known as:  LOPRESSOR  Take 1 tablet (25 mg total) by mouth 2 (two) times daily.     naproxen sodium 220 MG tablet  Commonly known as:  ANAPROX  Take 220-440 mg by mouth 2 (two) times daily as needed (for pain).     QUEtiapine 50 MG tablet  Commonly known as:  SEROQUEL  Take 50 mg by mouth 2 (two) times daily.        Relevant Imaging Results:  Relevant Lab Results:  Recent Labs    Additional Information    Ned Card, LCSW

## 2015-01-02 NOTE — ED Notes (Signed)
NAD noted at this time. Pt sitting up in chair eating lunch at this time. Respirations are even and unlabored a this time. Will continue with 15 min safety checks.

## 2015-01-02 NOTE — ED Notes (Signed)
NAD noted at this time. Pt resting in chair with his eyes closed. Pt requested urinal but was unable to use it. Respirations even and unlabored at this time.

## 2015-01-02 NOTE — ED Notes (Signed)
NAD noted at this time. Pt given meal tray at this time. Respirations even and unlabored. Will continue with 15 min safety checks at this time.

## 2015-01-02 NOTE — Discharge Summary (Addendum)
Physician Discharge Summary Note  Patient:  Nicholas [name] is an 81 y.o., male MRN:  [id number] DOB:  11-Sep-1931 Patient phone:  [phone] (home)  Patient address:   [address] [place] [address],  Total Time spent with patient: 30 minutes  Date of Admission:  12/26/2014 Date of Discharge: 01/02/2015  Reason for Admission:  Patient was brought to the hospital because of aggressive behavior at home against his wife related to his dementia  Principal Problem: Alzheimer's dementia with behavioral disturbance Discharge Diagnoses: Patient Active Problem List   Diagnosis Date Noted  . Alzheimer's dementia with behavioral disturbance [G30.8] 12/26/2014  . SVT (supraventricular tachycardia) (HCC) [I47.1] 11/26/2014    Musculoskeletal: Strength & Muscle Tone: decreased Gait & Station: unable to stand Patient leans: N/A  Psychiatric Specialty Exam: Physical Exam  Nursing note and vitals reviewed. Constitutional: He appears well-developed and well-nourished. He appears lethargic.  Non-toxic appearance. He does not have a sickly appearance. He does not appear ill.  HENT:  Head: Normocephalic and atraumatic.  Eyes: Conjunctivae are normal. Pupils are equal, round, and reactive to light.  Neck: Normal range of motion.  Cardiovascular: Normal heart sounds.   Respiratory: Effort normal.  GI: Soft.  Musculoskeletal: Normal range of motion.  Neurological: He appears lethargic.  Skin: Skin is warm and dry.  Psychiatric: Judgment and thought content normal. His affect is blunt. His speech is delayed. He is slowed. Cognition and memory are impaired. He exhibits abnormal recent memory and abnormal remote memory.    Review of Systems  Unable to perform ROS: mental acuity    Blood pressure 122/84, pulse 68, temperature 98.9 F (37.2 C), temperature source Oral, resp. rate 20, height  (1.854 m), weight 105 kg (231 lb 7.7 oz), SpO2 93 %.Body mass index is 30.55 kg/(m^2).   General Appearance: Disheveled  Eye Contact::  Minimal  Speech:  Slow and Slurred  Volume:  Decreased  Mood:  Negative  Affect:  Flat  Thought Process:  Minimal. He is very demented  Orientation:  Negative  Thought Content:  Negative  Suicidal Thoughts:  No  Homicidal Thoughts:  No  Memory:  Immediate;   Poor Recent;   Poor Remote;   Poor  Judgement:  Impaired  Insight:  Lacking  Psychomotor Activity:  Decreased  Concentration:  Poor  Recall:  Poor  Fund of Knowledge:Poor  Language: Poor  Akathisia:  No  Handed:  Right  AIMS (if indicated):     Assets:  Social Support  ADL's:  Impaired  Cognition: Impaired,  Moderate  Sleep:         Has this patient used any form of tobacco in the last 30 days? (Cigarettes, Smokeless Tobacco, Cigars, and/or Pipes) No  Past Medical History:  Past Medical History  Diagnosis Date  . Alzheimer disease     Past Surgical History  Procedure Laterality Date  . Coronary angioplasty with stent placement     Family History:  Family History  Problem Relation Age of Onset  . CVA Mother   . Peripheral vascular disease Father   . Diabetes Father   . Heart attack Brother    Social History:  History  Alcohol Use No     History  Drug Use No    Social History   Social History  . Marital Status: Married    Spouse Name: N/A  . Number of Children: N/A  . Years of Education: N/A   Social History Main Topics  . Smoking  status: Former Games developermoker  . Smokeless tobacco: None  . Alcohol Use: No  . Drug Use: No  . Sexual Activity: Not Asked   Other Topics Concern  . None   Social History Narrative    Past Psychiatric History: Hospitalizations:  Outpatient Care:  Substance Abuse Care:  Self-Mutilation:  Suicidal Attempts:  Violent Behaviors:   Risk to Self: Suicidal Ideation: No Suicidal Intent: No Is patient at risk for suicide?: No Suicidal Plan?: No Access to Means: No What has been your use of drugs/alcohol within the last  12 months?: None reported How many times?: 0 Other Self Harm Risks: N/A Triggers for Past Attempts: None known Intentional Self Injurious Behavior: None Risk to Others: Homicidal Ideation: No Thoughts of Harm to Others: No Current Homicidal Intent: No Current Homicidal Plan: No Access to Homicidal Means: No Identified Victim: N/A History of harm to others?: Yes Assessment of Violence: On admission Violent Behavior Description: Pt is violent towards wife and other family members. Does patient have access to weapons?: No Criminal Charges Pending?: No Does patient have a court date: No Prior Inpatient Therapy: Prior Inpatient Therapy: No Prior Therapy Dates: N/A Prior Therapy Facilty/Provider(s): N/A Reason for Treatment: N/A Prior Outpatient Therapy: Prior Outpatient Therapy: No Prior Therapy Dates: N/a Prior Therapy Facilty/Provider(s): N/A Reason for Treatment: N/A Does patient have an ACCT team?: No Does patient have Intensive In-House Services?  : No Does patient have [facility] services? : No Does patient have P4CC services?: No  [facility]  [facility]  Hospital Course:  Patient has been in the emergency room since he presented to the hospital. We have attempted to have him referred to a geriatric psychiatry unit without any success. Here in the emergency room he has not been aggressive violent or dangerous whatsoever. He has been treated with Seroquel which seems to of been quite effective in keeping him sedated and his affect and mood calm. He continues to be very demented but is able to eat food and with assistance can go to the bathroom. It is clear that he is not safe to return home to his wife and family because of his dangerous behavior. At long last we have had him accepted to rehabilitation based on the fact that he has become deconditioned. He will be discharged on his current medicine. Keflex can be continued for a full week of therapy and then discontinued. No other change to  another medicine. No need for specific psychiatry follow-up  Consults:  Psychiatry  Significant Diagnostic Studies:  None  Discharge Vitals:   Blood pressure 122/84, pulse 68, temperature 98.9 F (37.2 C), temperature source Oral, resp. rate 20, height 6\' 1"  (1.854 m), weight 105 kg (231 lb 7.7 oz), SpO2 93 %. Body mass index is 30.55 kg/(m^2). Lab Results:   Results for orders placed or performed during the hospital encounter of 12/26/14 (from the past 72 hour(s))  Glucose, capillary     Status: Abnormal   Collection Time: 01/01/15 10:20 PM  Result Value Ref Range   Glucose-Capillary 121 (H) 65 - 99 mg/dL    Physical Findings: AIMS:  , ,  ,  ,    CIWA:    COWS:      See Psychiatric Specialty Exam and Suicide Risk Assessment completed by Attending Physician prior to discharge.  Discharge destination:  ALF  Is patient on multiple antipsychotic therapies at discharge:  No   Has Patient had three or more failed trials of antipsychotic monotherapy by history:  No    Recommended Plan for Multiple Antipsychotic Therapies: NA     Medication List    ASK your doctor about these medications      Indication   cetirizine 10 MG tablet  Commonly known as:  ZYRTEC  Take 10 mg by mouth daily as needed for allergies.      diphenhydrAMINE 25 MG tablet  Commonly known as:  BENADRYL  Take 25 mg by mouth at bedtime as needed for itching or sleep.      metoprolol tartrate 25 MG tablet  Commonly known as:  LOPRESSOR  Take 1 tablet (25 mg total) by mouth 2 (two) times daily.      naproxen sodium 220 MG tablet  Commonly known as:  ANAPROX  Take 220-440 mg by mouth 2 (two) times daily as needed (for pain).      QUEtiapine 50 MG tablet  Commonly known as:  SEROQUEL  Take 50 mg by mouth 2 (two) times daily.            Follow-up Information    Follow up with [facility] .   Specialty:  Skilled Nursing Facility   Contact information:   [address] [place] [address] [phone]      Follow-up recommendations:  Activity:  Activity as tolerated Diet:  Diet regular  Comments:  Patient can be discharged to rehabilitation and from there can be placed as appropriate  Total Discharge Time: 30  Signed: Mordecai Rasmussen 01/02/2015, 5:37 PM

## 2015-01-02 NOTE — ED Notes (Signed)
NAD noted at this time. Pt resting in chair with eyes open. Pt calm and cooperative with staff at this time. Will continue with 15 min safety checks at this time.

## 2015-01-02 NOTE — ED Notes (Signed)
Pt assisted with urinal, given beverage

## 2015-01-02 NOTE — ED Notes (Signed)
NAD noted at this time. Pt resting in chair. Pt alert and calm with staff at this time. Will continue with 15 min safety checks at this time.

## 2015-01-02 NOTE — ED Notes (Signed)
Pt resting in bed at this time. Pt placed in bed with 2 person max assist. NAD noted at this time. Will continue with 15 min safety rounds.

## 2015-01-02 NOTE — ED Notes (Signed)
ED BHU PLACEMENT JUSTIFICATION Is the patient under IVC or is there intent for IVC: Yes.   Is the patient medically cleared: Yes.   Is there vacancy in the ED BHU: Yes.   Is the population mix appropriate for patient: No. Is the patient awaiting placement in inpatient or outpatient setting: Yes.   Has the patient had a psychiatric consult: Yes.   Survey of unit performed for contraband, proper placement and condition of furniture, tampering with fixtures in bathroom, shower, and each patient room: Yes.  ; Findings:  APPEARANCE/BEHAVIOR calm, cooperative and adequate rapport can be established NEURO ASSESSMENT Orientation: person Hallucinations: No.None noted (Hallucinations) Speech: Normal Gait: unsteady, Pt requires maximum assist with ambulation. Pt is barely able to stand at this time without maximum assistance.  RESPIRATORY ASSESSMENT Normal expansion.  Clear to auscultation.  No rales, rhonchi, or wheezing. CARDIOVASCULAR ASSESSMENT regular rate and rhythm, S1, S2 normal, no murmur, click, rub or gallop GASTROINTESTINAL ASSESSMENT soft, nontender, BS WNL, no r/g EXTREMITIES Weakness of extremities. PLAN OF CARE Provide calm/safe environment. Vital signs assessed twice daily. ED BHU Assessment once each 12-hour shift. Collaborate with intake RN daily or as condition indicates. Assure the ED provider has rounded once each shift. Provide and encourage hygiene. Provide redirection as needed. Assess for escalating behavior; address immediately and inform ED provider.  Assess family dynamic and appropriateness for visitation as needed: Yes.  ; If necessary, describe findings:  Educate the patient/family about BHU procedures/visitation: Yes.  ; If necessary, describe findings:

## 2015-01-02 NOTE — Progress Notes (Signed)
CSW confirmed SNF is ready once DC summary is complete. RN will fax dc summary and call report to SNF. Pt will transfer via EMS. CSW called Pt's wife who stated that she will meet Pt at SNF after going to get some dinner. Pt's wife appreciative and states that she is hopeful he will stabilize and strengthen enough at SNF to be able to return home.   Nicholas Gristara Lenorris Karger, LCSW [phone]

## 2015-01-02 NOTE — Clinical Social Work Placement (Signed)
   CLINICAL SOCIAL WORK PLACEMENT  NOTE  Date:  01/02/2015  Patient Details  Name: Nicholas [name] MRN: [id number] Date of Birth: 1931/06/18  Clinical Social Work is seeking post-discharge placement for this patient at the Skilled  Nursing Facility level of care (*CSW will initial, date and re-position this form in  chart as items are completed):  Yes   Patient/family provided with [facility] Social Work Department's list of facilities offering this level of care within the geographic area requested by the patient (or if unable, by the patient's family).  Yes   Patient/family informed of their freedom to choose among providers that offer the needed level of care, that participate in Medicare, Medicaid or managed care program needed by the patient, have an available bed and are willing to accept the patient.  Yes   Patient/family informed of [facility]'s ownership interest in [facility] and [facility], as well as of the fact that they are under no obligation to receive care at these facilities.  PASRR submitted to EDS on 01/02/15     PASRR number received on 01/02/15     Existing PASRR number confirmed on       FL2 transmitted to all facilities in geographic area requested by pt/family on 01/02/15     FL2 transmitted to all facilities within larger geographic area on       Patient informed that his/her managed care company has contracts with or will negotiate with certain facilities, including the following:        Yes   Patient/family informed of bed offers received.  [name]     Physician recommends and patient chooses bed at      Patient to be transferred to [name] on 01/02/15.  Patient to be transferred to facility by EMS     Patient family notified on 01/02/15 of transfer.  Name of family member notified:  Wife     PHYSICIAN       Additional Comment:     _______________________________________________ Ned Cardara N Mallorie [place], LCSW 01/02/2015, 10:34 PM

## 2015-01-02 NOTE — ED Notes (Signed)
NAD noted at this time. Pt resting chair. Pt used urinal during this time. Pt denies needs. Will continue with 15 min safety checks.

## 2015-01-02 NOTE — Progress Notes (Signed)
Late entry 01/02/15  CSW met liaison from [facility] in ED to meet with Pt. CSW and liaison met with Pt to discuss going to rehab to increase strength. Pt seemed to understand that he is weaker than his baseline. CSW told Pt that his wife is in the ED and he stated that he wanted to see her. Pt's wife brought back to room by RN. Pt's wife and liaison from [facility] discussed Pt's transfer later this afternoon. Pt's wife pleased with opportunity for Pt to stay close to home.  CSW explained to wife that hospice services would have to be revoked if Pt is going to go to rehab. Pt's wife is in agreement and understands that Pt can re-enroll in hospice services if appropriate at dc from SNF.    CSW updated Dr. Weber Cooks with plan. Dr. Weber Cooks will provide dc summary to help with Pt's transfer to SNF.    Toma Copier, [place]

## 2015-01-02 NOTE — ED Notes (Signed)
NAD noted at this time. Pt visualized to be resting in the chair with his eyes closed. Respirations are even and unlabored a this time. Pt assisted to stand to use urinal.

## 2015-01-23 DEATH — deceased

## 2016-03-01 IMAGING — CT CT HEAD W/O CM
2 of 5 series · 14 of 30 positions shown, 16 images · non-contrast
Comparison: Neck CT 03/29/2006.

CLINICAL DATA: Status post fall. Left head laceration. No loss of
consciousness. Initial encounter.

EXAM:
CT HEAD WITHOUT CONTRAST
TECHNIQUE: Contiguous axial images were obtained from the base of the skull
through the vertex without intravenous contrast.

[Series 2: soft tissue · axial · 0.43mm/px · z∈[+264,+384]mm · 7 of 34 slices shown]
[im 5/34  brain]
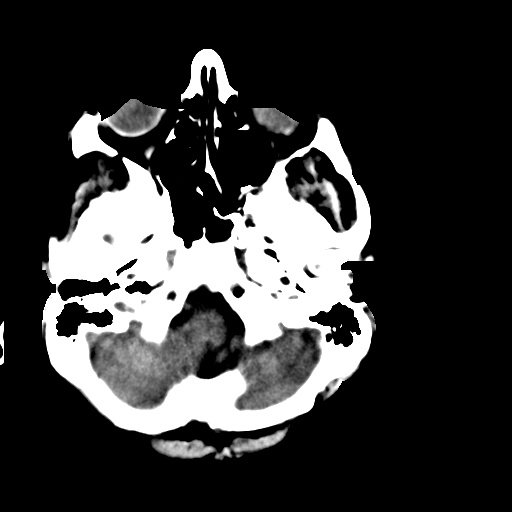
[im 9/34  brain]
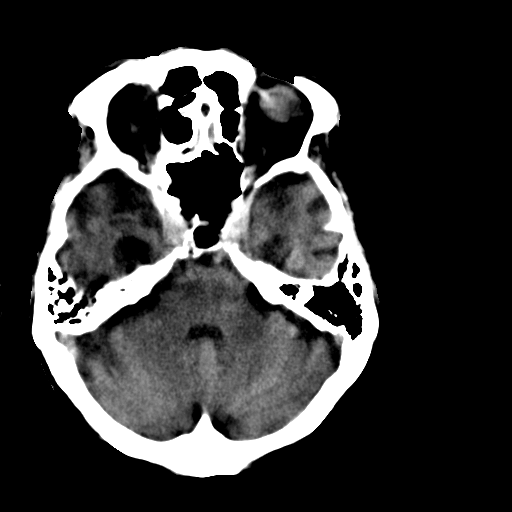
[im 13/34  brain]
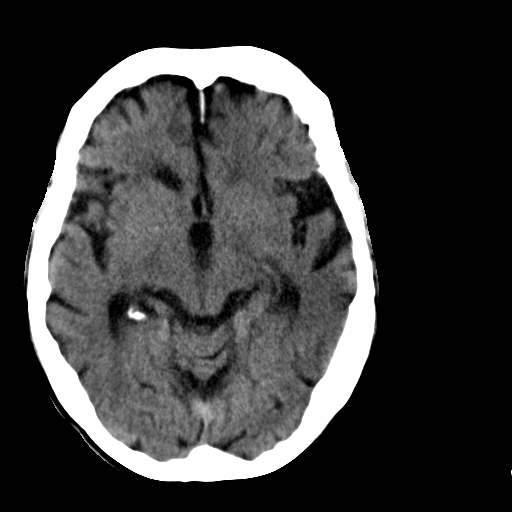
[im 17/34  brain]
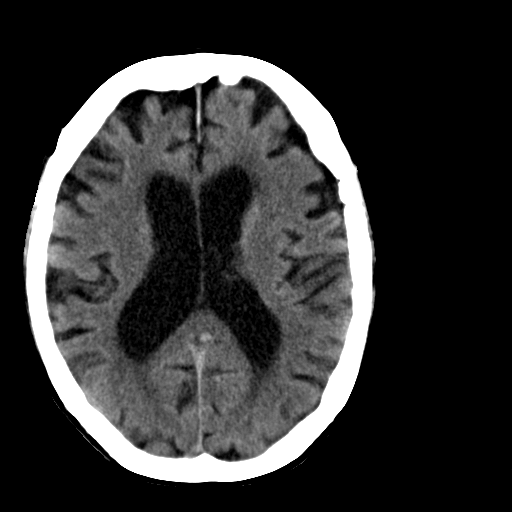
[im 21/34  brain]
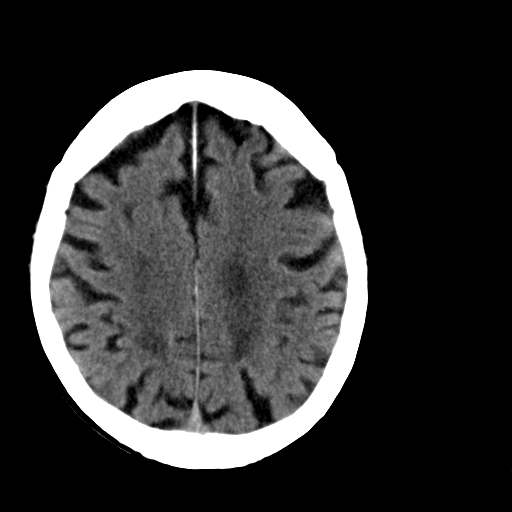
[im 25/34  brain]
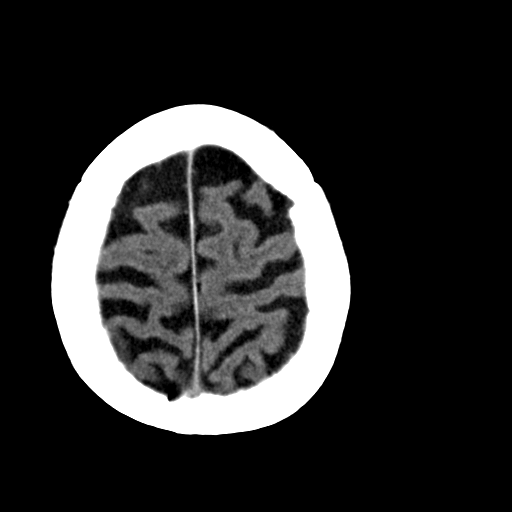
[im 29/34  brain]
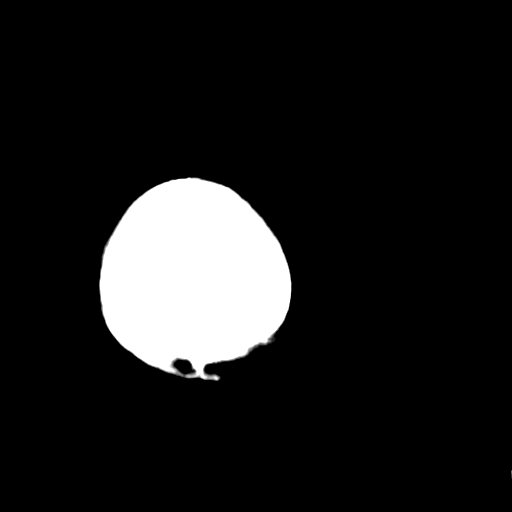

[Series 6: soft tissue recon · axial · 0.34mm/px · z∈[+276,+385]mm · 7 of 30 slices shown, 9 images]
[im 4/30  brain]
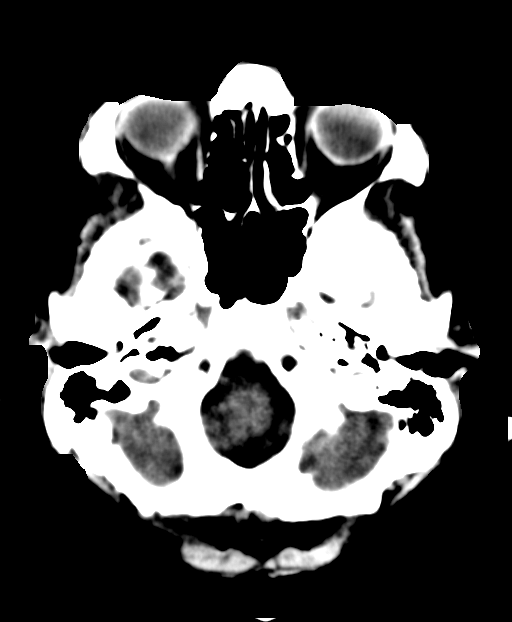
[im 4/30  bone]
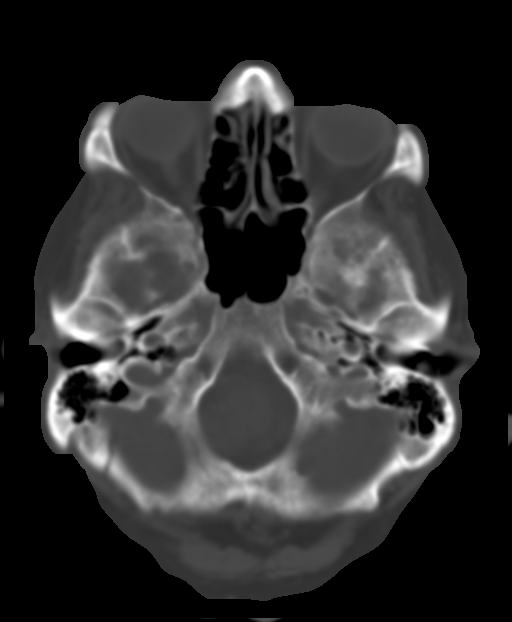
[im 8/30  brain]
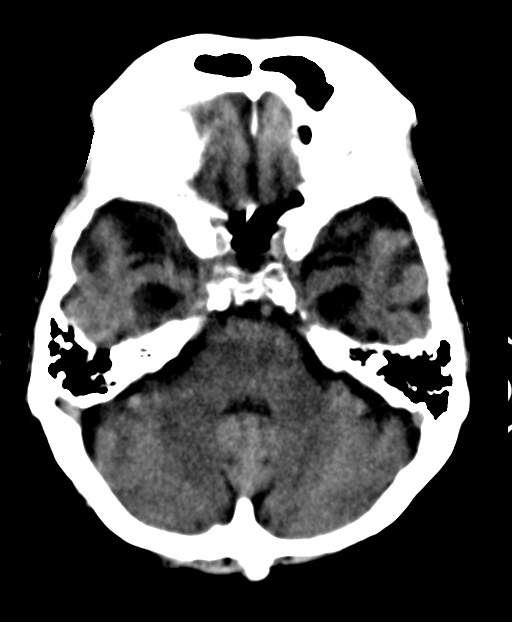
[im 11/30  brain]
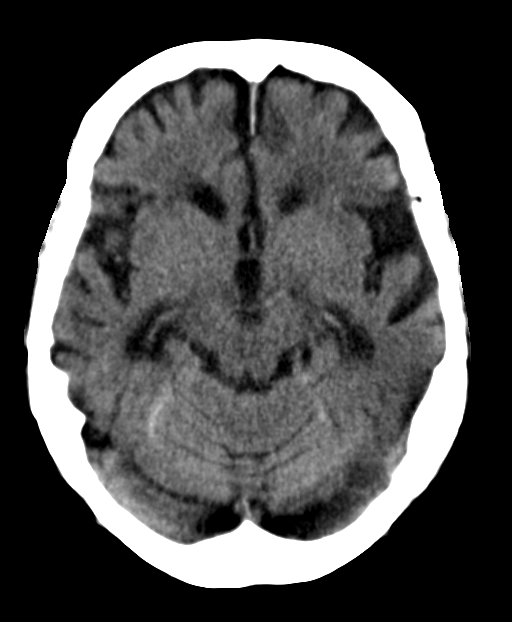
[im 15/30  brain]
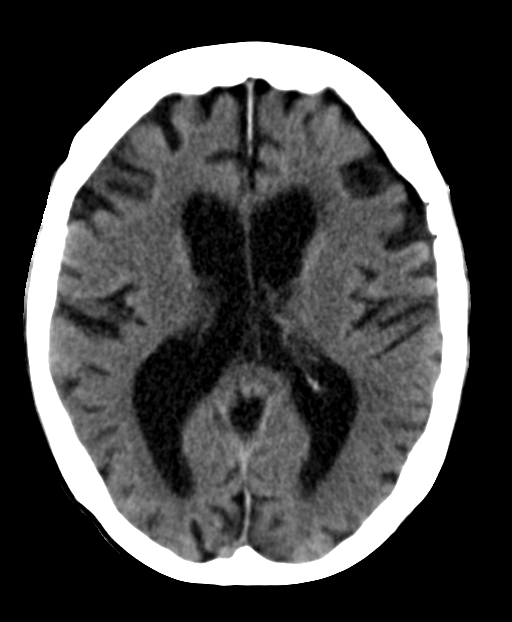
[im 19/30  brain]
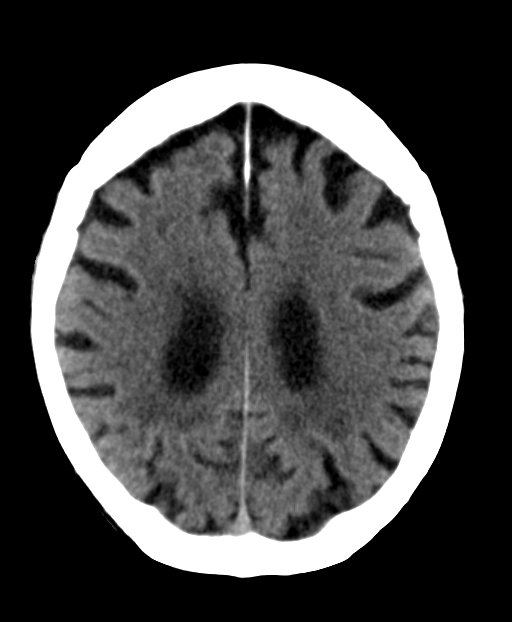
[im 19/30  bone]
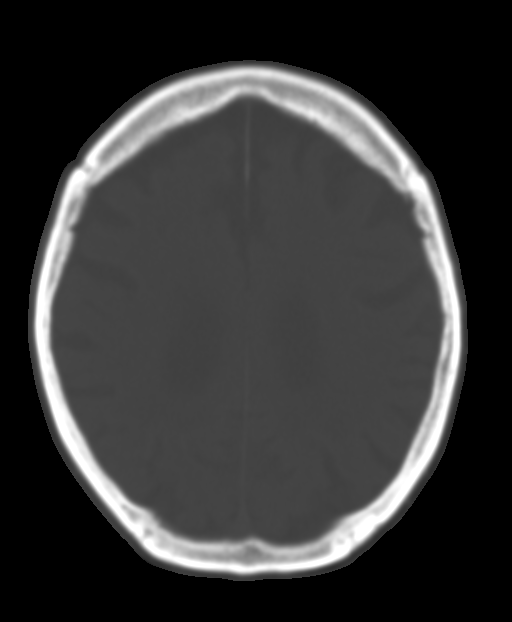
[im 22/30  brain]
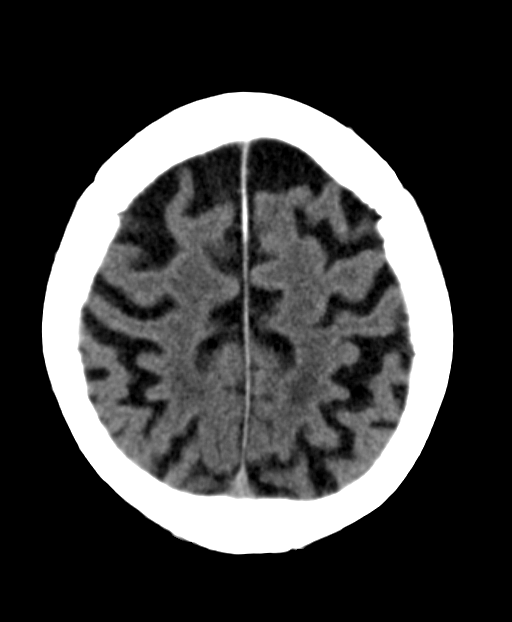
[im 26/30  brain]
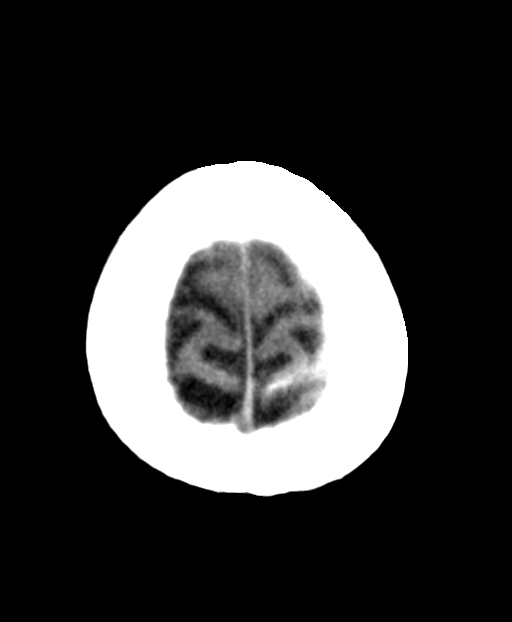

[14 of 30 positions shown; findings below may reference images not displayed]

FINDINGS: There is no evidence of acute intracranial hemorrhage, mass lesion,
brain edema or extra-axial fluid collection. The ventricles and
subarachnoid spaces are diffusely prominent, consistent with
moderate atrophy. There is no CT evidence of acute cortical
infarction. There are mild chronic small vessel ischemic changes
within the periventricular and subcortical white matter bilaterally.

The visualized paranasal sinuses, mastoid air cells and middle ears
are clear. The calvarium is intact. There is mild soft tissue
swelling in the left frontoparietal scalp.
IMPRESSION: 1. Left scalp soft tissue injury. No acute intracranial or calvarial
findings.
2. Moderate atrophy and mild chronic small vessel ischemic changes.
